# Patient Record
Sex: Male | Born: 1990 | Race: Black or African American | Hispanic: No | Marital: Single | Smoking: Current every day smoker
Health system: Southern US, Community
[De-identification: ages and names within clinical notes are randomized; demographics above are authoritative.]

---
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
## 2014-12-18 ENCOUNTER — Encounter (HOSPITAL_COMMUNITY): Payer: Self-pay | Admitting: Emergency Medicine

## 2014-12-18 ENCOUNTER — Emergency Department (HOSPITAL_COMMUNITY)
Admission: EM | Admit: 2014-12-18 | Discharge: 2014-12-18 | Disposition: A | Discharge status: Home / Self Care | Payer: Self-pay | Attending: Emergency Medicine | Admitting: Emergency Medicine

## 2014-12-18 DIAGNOSIS — J029 Acute pharyngitis, unspecified: Secondary | ICD-10-CM | POA: Insufficient documentation

## 2014-12-18 MED ORDER — PENICILLIN G BENZATHINE 1200000 UNIT/2ML IM SUSP
1.2000 10*6.[IU] | Freq: Once | INTRAMUSCULAR | Status: AC
  Administered 2014-12-18: 1.2 10*6.[IU] via INTRAMUSCULAR
  Filled 2014-12-18: qty 2

## 2014-12-18 NOTE — ED Provider Notes (Signed)
CSN: [id number]     Arrival date & time 12/18/14  1108 History   First MD Initiated Contact with Patient 12/18/14 1150     Chief Complaint  Patient presents with  . Sore Throat    white patches     (Consider location/radiation/quality/duration/timing/severity/associated sxs/prior Treatment) The history is provided by the patient and medical records.    This is a 24 year old male with no significant past medical history presenting to the ED for sore throat. Patient states this initially began 2 days ago and has been progressively worsening. States it is very painful to swallow, no difficulty eating or drinking though.He denies any fever, did have mild headache.No nausea, vomiting or diarrhea. No chest pain shortness of breath.  No intervention tried prior to arrival. Vital signs stable.  No past medical history on file. No past surgical history on file. No family history on file. Social History  Substance Use Topics  . Smoking status: Not on file  . Smokeless tobacco: Not on file  . Alcohol Use: Not on file    Review of Systems  HENT: Positive for sore throat.   All other systems reviewed and are negative.     Allergies  Review of patient's allergies indicates not on file.  Home Medications   Prior to Admission medications   Not on File   BP 121/73 mmHg  Pulse 91  Temp(Src) 99.1 F (37.3 C) (Oral)  Resp 20  SpO2 99% Physical Exam  Constitutional: He is oriented to person, place, and time. He appears well-developed and well-nourished. No distress.  HENT:  Head: Normocephalic and atraumatic.  Mouth/Throat: Uvula is midline, oropharynx is clear and moist and mucous membranes are normal. Normal dentition. No oropharyngeal exudate, posterior oropharyngeal edema, posterior oropharyngeal erythema or tonsillar abscesses.  Tonsils 1+ bilaterally with exudates present; uvula midline without peritonsillar abscess; handling secretions appropriately; no difficulty swallowing or  speaking; normal phonation, no stridor  Eyes: Conjunctivae and EOM are normal. Pupils are equal, round, and reactive to light.  Neck: Normal range of motion. Neck supple.  Cardiovascular: Normal rate, regular rhythm and normal heart sounds.   Pulmonary/Chest: Effort normal and breath sounds normal. No respiratory distress. He has no wheezes.  Musculoskeletal: Normal range of motion. He exhibits no edema.  Neurological: He is alert and oriented to person, place, and time.  Skin: Skin is warm and dry. He is not diaphoretic.  Psychiatric: He has a normal mood and affect.  Nursing note and vitals reviewed.   ED Course  Procedures (including critical care time) Labs Review Labs Reviewed - No data to display  Imaging Review No results found.   EKG Interpretation None      MDM   Final diagnoses:  Sore throat   24 year old male here with sore throat for the past 2 days. Progressively worsening. Patient afebrile, nontoxic. Tonsils 1+ bilaterally with exudate present. Handling secretions well, no difficulty swallowing. No clinical signs of peritonsillar abscess at this time. Patient treated empirically with Bicillin in the ED. Instructed to continue supportive care at home.  Discussed plan with patient, he/she acknowledged understanding and agreed with plan of care.  Return precautions given for new or worsening symptoms.  Garlon Hatchet, PA-C 12/18/14 1248  Melene Plan, DO 12/18/14 [id number]

## 2014-12-18 NOTE — Discharge Instructions (Signed)
You have been treated for strep throat. You should start improving in the next 24-48 hours.  May continue to use over the counter chloraseptic and/or salt water gargles to help with throat pain. Return to the ED for new or worsening symptoms.

## 2014-12-27 ENCOUNTER — Emergency Department (HOSPITAL_COMMUNITY)
Admission: EM | Admit: 2014-12-27 | Discharge: 2014-12-27 | Disposition: A | Discharge status: Home / Self Care | Payer: Self-pay | Attending: Emergency Medicine | Admitting: Emergency Medicine

## 2014-12-27 ENCOUNTER — Encounter (HOSPITAL_COMMUNITY): Payer: Self-pay | Admitting: *Deleted

## 2014-12-27 DIAGNOSIS — R300 Dysuria: Secondary | ICD-10-CM | POA: Insufficient documentation

## 2014-12-27 DIAGNOSIS — A539 Syphilis, unspecified: Secondary | ICD-10-CM

## 2014-12-27 LAB — URINALYSIS, ROUTINE W REFLEX MICROSCOPIC
Glucose, UA: NEGATIVE mg/dL
HGB URINE DIPSTICK: NEGATIVE
KETONES UR: 15 mg/dL — AB
Leukocytes, UA: NEGATIVE
Nitrite: NEGATIVE
PROTEIN: NEGATIVE mg/dL
SPECIFIC GRAVITY, URINE: 1.035 — AB (ref 1.005–1.030)
UROBILINOGEN UA: 1 mg/dL (ref 0.0–1.0)
pH: 6 (ref 5.0–8.0)

## 2014-12-27 MED ORDER — PENICILLIN G BENZATHINE 1200000 UNIT/2ML IM SUSP
2.4000 10*6.[IU] | Freq: Once | INTRAMUSCULAR | Status: AC
  Administered 2014-12-27: 2.4 10*6.[IU] via INTRAMUSCULAR
  Filled 2014-12-27: qty 4

## 2014-12-27 NOTE — ED Provider Notes (Signed)
CSN: [id number]     Arrival date & time 12/27/14  1417 History  This chart was scribed for non-physician practitioner, Teressa Lower, NP, working with Elwin Mocha, MD by Charline Bills, ED Scribe. This patient was seen in room TR05C/TR05C and the patient's care was started at 2:28 PM.   Chief Complaint  Patient presents with  . Exposure to STD   The history is provided by the patient. No language interpreter was used.   HPI Comments: Jimmy [name] is a 24 y.o. male who presents to the Emergency Department for STD exposure. Pt reports a rash to his penis first noticed a few days ago. He states that he was recently released from prison in which he served 5.5 years. Pt reports associated dysuria for the past few days as well. He denies penile discharge or rash on his hands. No h/o STD.   History reviewed. No pertinent past medical history. History reviewed. No pertinent past surgical history. No family history on file. Social History  Substance Use Topics  . Smoking status: Never Smoker   . Smokeless tobacco: None  . Alcohol Use: None    Review of Systems  Genitourinary: Positive for dysuria. Negative for discharge.  Skin: Positive for rash.  All other systems reviewed and are negative.  Allergies  Review of patient's allergies indicates no known allergies.  Home Medications   Prior to Admission medications   Not on File   BP 129/85 mmHg  Pulse 67  Temp(Src) 97.7 F (36.5 C) (Oral)  Resp 16  SpO2 97% Physical Exam  Constitutional: He is oriented to person, place, and time. He appears well-developed and well-nourished. No distress.  HENT:  Head: Normocephalic and atraumatic.  Eyes: Conjunctivae and EOM are normal.  Neck: Neck supple. No tracheal deviation present.  Cardiovascular: Normal rate.   Pulmonary/Chest: Effort normal. No respiratory distress.  Genitourinary: No discharge found.  Bumps on penis.   Musculoskeletal: Normal range of motion.  Neurological: He is  alert and oriented to person, place, and time.  Skin: Skin is warm and dry.  Psychiatric: He has a normal mood and affect. His behavior is normal.  Nursing note and vitals reviewed.  ED Course  Procedures (including critical care time) DIAGNOSTIC STUDIES: Oxygen Saturation is 97% on RA, normal by my interpretation.    COORDINATION OF CARE: 2:31 PM-Discussed treatment plan which includes STD screening and Bicillin LA injection with pt at bedside and pt agreed to plan.   Labs Review Labs Reviewed  URINALYSIS, ROUTINE W REFLEX MICROSCOPIC (NOT AT [facility]) - Abnormal; Notable for the following:    Color, Urine AMBER (*)    Specific Gravity, Urine 1.035 (*)    Bilirubin Urine SMALL (*)    Ketones, ur 15 (*)    All other components within normal limits  HIV ANTIBODY (ROUTINE TESTING)  RPR  GC/CHLAMYDIA PROBE AMP ([place]) NOT AT [facility]   Imaging Review No results found. I have personally reviewed and evaluated these images and lab results as part of my medical decision-making.   EKG Interpretation None      MDM   Final diagnoses:  Syphilis    Pt treated for syphilis here. Cultures sent. No infection noted in urine. No rash to hand or feet  I personally performed the services described in this documentation, which was scribed in my presence. The recorded information has been reviewed and is accurate.    Teressa Lower, NP 12/27/14 1534  Elwin Mocha, MD 12/27/14 [phone]

## 2014-12-27 NOTE — Discharge Instructions (Signed)
Syphilis Syphilis is an infectious disease. It can cause serious complications if left untreated.  CAUSES  Syphilis is caused by a type of bacteria called Treponema pallidum. It is most commonly spread through sexual contact. Syphilis may also spread to a fetus through the blood of the mother.  SIGNS AND SYMPTOMS Symptoms vary depending on the stage of the disease. Some symptoms may disappear without treatment. However, this does not mean that the infection is gone. One form of syphilis (called latent syphilis) has no symptoms.  Primary Syphilis  Painless sores (chancres) in and around the genital organs and mouth.  Swollen lymph nodes near the sores. Secondary Syphilis  A rash or sores over any portion of the body, including the palms of the hands and soles of the feet.  Fever.  Headache.  Sore throat.  Swollen lymph nodes.  New sores in the mouth or on the genitals.  Feeling generally ill.  Having pain in the joints. Tertiary Syphilis The third stage of syphilis involves severe damage to different organs in the body, such as the brain, spinal cord, and heart. Signs and symptoms may include:   Dementia.  Personality and mood changes.  Difficulty walking.  Heart failure.  Fainting.  Enlargement (aneurysm) of the aorta.  Tumors of the skin, bones, or liver.  Muscle weakness.  Sudden "lightning" pains, numbness, or tingling.  Problems with coordination.  Vision changes. DIAGNOSIS   A physical exam will be done.  Blood tests will be done to confirm the diagnosis.  If the disease is in the first or second stages, a fluid (drainage) sample from a sore or rash may be examined under a microscope to detect the disease-causing bacteria.  Fluid around the spine may need to be examined to detect brain damage or inflammation of the brain lining (meningitis).  If the disease is in the third stage, X-rays, CT scans, MRIs, echocardiograms, ultrasounds, or cardiac  catheterization may also be done to detect disease of the heart, aorta, or brain. TREATMENT  Syphilis can be cured with antibiotic medicine if a diagnosis is made early. During the first day of treatment, you may experience fever, chills, headache, nausea, or aching all over your body. This is a normal reaction to the antibiotics.  HOME CARE INSTRUCTIONS   Take your antibiotic medicine as directed by your health care provider. Finish the antibiotic even if you start to feel better. Incomplete treatment will put you at risk for continued infection and could be life threatening.  Take medicines only as directed by your health care provider.  Do not have sexual intercourse until your treatment is completed or as directed by your health care provider.  Inform your recent sexual partners that you were diagnosed with syphilis. They need to seek care and treatment, even if they have no symptoms. It is necessary that all your sexual partners be tested for infection and treated if they have the disease.  Keep all follow-up visits as directed by your health care provider. It is important to keep all your appointments.  If your test results are not ready during your visit, make an appointment with your health care provider to find out the results. Do not assume everything is normal if you have not heard from your health care provider or the medical facility. It is your responsibility to get your test results. SEEK MEDICAL CARE IF:  You continue to have any of the following 24 hours after beginning treatment:  Fever.  Chills.  Headache.  Nausea.  Aching all over your body.  You have symptoms of an allergic reaction to medicine, such as:  Chills.  A headache.  Light-headedness.  A new rash (especially hives).  Difficulty breathing. MAKE SURE YOU:   Understand these instructions.  Will watch your condition.  Will get help right away if you are not doing well or get worse. Document  Released: 02/02/2013 Document Revised: 08/29/2013 Document Reviewed: 02/02/2013 [facility] Patient Information 2015 [place], [place]. This information is not intended to replace advice given to you by your health care provider. Make sure you discuss any questions you have with your health care provider.  Sexually Transmitted Disease A sexually transmitted disease (STD) is a disease or infection that may be passed (transmitted) from person to person, usually during sexual activity. This may happen by way of saliva, semen, blood, vaginal mucus, or urine. Common STDs include:   Gonorrhea.   Chlamydia.   Syphilis.   HIV and AIDS.   Genital herpes.   Hepatitis B and C.   Trichomonas.   Human papillomavirus (HPV).   Pubic lice.   Scabies.  Mites.  Bacterial vaginosis. WHAT ARE CAUSES OF STDs? An STD may be caused by bacteria, a virus, or parasites. STDs are often transmitted during sexual activity if one person is infected. However, they may also be transmitted through nonsexual means. STDs may be transmitted after:   Sexual intercourse with an infected person.   Sharing sex toys with an infected person.   Sharing needles with an infected person or using unclean piercing or tattoo needles.  Having intimate contact with the genitals, mouth, or rectal areas of an infected person.   Exposure to infected fluids during birth. WHAT ARE THE SIGNS AND SYMPTOMS OF STDs? Different STDs have different symptoms. Some people may not have any symptoms. If symptoms are present, they may include:   Painful or bloody urination.   Pain in the pelvis, abdomen, vagina, anus, throat, or eyes.   A skin rash, itching, or irritation.  Growths, ulcerations, blisters, or sores in the genital and anal areas.  Abnormal vaginal discharge with or without bad odor.   Penile discharge in men.   Fever.   Pain or bleeding during sexual intercourse.   Swollen glands in the groin  area.   Yellow skin and eyes (jaundice). This is seen with hepatitis.   Swollen testicles.  Infertility.  Sores and blisters in the mouth. HOW ARE STDs DIAGNOSED? To make a diagnosis, your health care provider may:   Take a medical history.   Perform a physical exam.   Take a sample of any discharge to examine.  Swab the throat, cervix, opening to the penis, rectum, or vagina for testing.  Test a sample of your first morning urine.   Perform blood tests.   Perform a Pap test, if this applies.   Perform a colposcopy.   Perform a laparoscopy.  HOW ARE STDs TREATED? Treatment depends on the STD. Some STDs may be treated but not cured.   Chlamydia, gonorrhea, trichomonas, and syphilis can be cured with antibiotic medicine.   Genital herpes, hepatitis, and HIV can be treated, but not cured, with prescribed medicines. The medicines lessen symptoms.   Genital warts from HPV can be treated with medicine or by freezing, burning (electrocautery), or surgery. Warts may come back.   HPV cannot be cured with medicine or surgery. However, abnormal areas may be removed from the cervix, vagina, or vulva.   If your diagnosis is  confirmed, your recent sexual partners need treatment. This is true even if they are symptom-free or have a negative culture or evaluation. They should not have sex until their health care providers say it is okay. HOW CAN I REDUCE MY RISK OF GETTING AN STD? Take these steps to reduce your risk of getting an STD:  Use latex condoms, dental dams, and water-soluble lubricants during sexual activity. Do not use petroleum jelly or oils.  Avoid having multiple sex partners.  Do not have sex with someone who has other sex partners.  Do not have sex with anyone you do not know or who is at high risk for an STD.  Avoid risky sex practices that can break your skin.  Do not have sex if you have open sores on your mouth or skin.  Avoid drinking too  much alcohol or taking illegal drugs. Alcohol and drugs can affect your judgment and put you in a vulnerable position.  Avoid engaging in oral and anal sex acts.  Get vaccinated for HPV and hepatitis. If you have not received these vaccines in the past, talk to your health care provider about whether one or both might be right for you.   If you are at risk of being infected with HIV, it is recommended that you take a prescription medicine daily to prevent HIV infection. This is called pre-exposure prophylaxis (PrEP). You are considered at risk if:  You are a man who has sex with other men (MSM).  You are a heterosexual man or woman and are sexually active with more than one partner.  You take drugs by injection.  You are sexually active with a partner who has HIV.  Talk with your health care provider about whether you are at high risk of being infected with HIV. If you choose to begin PrEP, you should first be tested for HIV. You should then be tested every 3 months for as long as you are taking PrEP.  WHAT SHOULD I DO IF I THINK I HAVE AN STD?  See your health care provider.   Tell your sexual partner(s). They should be tested and treated for any STDs.  Do not have sex until your health care provider says it is okay. WHEN SHOULD I GET IMMEDIATE MEDICAL CARE? Contact your health care provider right away if:   You have severe abdominal pain.  You are a man and notice swelling or pain in your testicles.  You are a woman and notice swelling or pain in your vagina. Document Released: 07/05/2002 Document Revised: 04/19/2013 Document Reviewed: 11/02/2012 [facility] Patient Information 2015 [place], [place]. This information is not intended to replace advice given to you by your health care provider. Make sure you discuss any questions you have with your health care provider.

## 2014-12-27 NOTE — ED Notes (Signed)
Pt reports that his penis is "burning and has bumps on it". Denies drainage

## 2014-12-28 LAB — HIV ANTIBODY (ROUTINE TESTING W REFLEX): HIV Screen 4th Generation wRfx: NONREACTIVE

## 2014-12-28 LAB — RPR: RPR: NONREACTIVE

## 2014-12-28 LAB — GC/CHLAMYDIA PROBE AMP (~~LOC~~) NOT AT ARMC
CHLAMYDIA, DNA PROBE: NEGATIVE
Neisseria Gonorrhea: NEGATIVE

## 2015-04-20 ENCOUNTER — Encounter (HOSPITAL_COMMUNITY): Payer: Self-pay | Admitting: *Deleted

## 2015-04-20 ENCOUNTER — Emergency Department (HOSPITAL_COMMUNITY)
Admission: EM | Admit: 2015-04-20 | Discharge: 2015-04-20 | Disposition: A | Discharge status: Home / Self Care | Payer: Self-pay | Attending: Emergency Medicine | Admitting: Emergency Medicine

## 2015-04-20 DIAGNOSIS — Z113 Encounter for screening for infections with a predominantly sexual mode of transmission: Secondary | ICD-10-CM | POA: Insufficient documentation

## 2015-04-20 DIAGNOSIS — Y9289 Other specified places as the place of occurrence of the external cause: Secondary | ICD-10-CM | POA: Insufficient documentation

## 2015-04-20 DIAGNOSIS — Z711 Person with feared health complaint in whom no diagnosis is made: Secondary | ICD-10-CM

## 2015-04-20 DIAGNOSIS — S0501XA Injury of conjunctiva and corneal abrasion without foreign body, right eye, initial encounter: Secondary | ICD-10-CM | POA: Insufficient documentation

## 2015-04-20 DIAGNOSIS — X58XXXA Exposure to other specified factors, initial encounter: Secondary | ICD-10-CM | POA: Insufficient documentation

## 2015-04-20 DIAGNOSIS — Y9389 Activity, other specified: Secondary | ICD-10-CM | POA: Insufficient documentation

## 2015-04-20 DIAGNOSIS — F17210 Nicotine dependence, cigarettes, uncomplicated: Secondary | ICD-10-CM | POA: Insufficient documentation

## 2015-04-20 DIAGNOSIS — B3749 Other urogenital candidiasis: Secondary | ICD-10-CM | POA: Insufficient documentation

## 2015-04-20 DIAGNOSIS — Y998 Other external cause status: Secondary | ICD-10-CM | POA: Insufficient documentation

## 2015-04-20 MED ORDER — FLUORESCEIN SODIUM 1 MG OP STRP
1.0000 | ORAL_STRIP | Freq: Once | OPHTHALMIC | Status: AC
  Administered 2015-04-20: 1 via OPHTHALMIC
  Filled 2015-04-20: qty 1

## 2015-04-20 MED ORDER — CLOTRIMAZOLE 1 % EX CREA: TOPICAL_CREAM | CUTANEOUS | Status: AC

## 2015-04-20 MED ORDER — OXYCODONE-ACETAMINOPHEN 5-325 MG PO TABS: 1.0000 | ORAL_TABLET | Freq: Once | ORAL | Status: DC

## 2015-04-20 MED ORDER — TETRACAINE HCL 0.5 % OP SOLN
1.0000 [drp] | Freq: Once | OPHTHALMIC | Status: AC
  Administered 2015-04-20: 2 [drp] via OPHTHALMIC
  Filled 2015-04-20: qty 2

## 2015-04-20 MED ORDER — ERYTHROMYCIN 5 MG/GM OP OINT: TOPICAL_OINTMENT | OPHTHALMIC | Status: AC

## 2015-04-20 NOTE — ED Notes (Signed)
Pt stable, ambulatory, states understanding of discharge instructions 

## 2015-04-20 NOTE — Discharge Instructions (Signed)
Corneal Abrasion The cornea is the clear covering at the front and center of the eye. When looking at the colored portion of the eye (iris), you are looking through the cornea. This very thin tissue is made up of many layers. The surface layer is a single layer of cells (corneal epithelium) and is one of the most sensitive tissues in the body. If a scratch or injury causes the corneal epithelium to come off, it is called a corneal abrasion. If the injury extends to the tissues below the epithelium, the condition is called a corneal ulcer. CAUSES   Scratches.  Trauma.  Foreign body in the eye. Some people have recurrences of abrasions in the area of the original injury even after it has healed (recurrent erosion syndrome). Recurrent erosion syndrome generally improves and goes away with time. SYMPTOMS   Eye pain.  Difficulty or inability to keep the injured eye open.  The eye becomes very sensitive to light.  Recurrent erosions tend to happen suddenly, first thing in the morning, usually after waking up and opening the eye. DIAGNOSIS  Your health care provider can diagnose a corneal abrasion during an eye exam. Dye is usually placed in the eye using a drop or a small paper strip moistened by your tears. When the eye is examined with a special light, the abrasion shows up clearly because of the dye. TREATMENT   Small abrasions may be treated with antibiotic drops or ointment alone.  A pressure patch may be put over the eye. If this is done, follow your doctor's instructions for when to remove the patch. Do not drive or use machines while the eye patch is on. Judging distances is hard to do with a patch on. If the abrasion becomes infected and spreads to the deeper tissues of the cornea, a corneal ulcer can result. This is serious because it can cause corneal scarring. Corneal scars interfere with light passing through the cornea and cause a loss of vision in the involved eye. HOME CARE  INSTRUCTIONS  Use medicine or ointment as directed. Only take over-the-counter or prescription medicines for pain, discomfort, or fever as directed by your health care provider.  Do not drive or operate machinery if your eye is patched. Your ability to judge distances is impaired.  If your health care provider has given you a follow-up appointment, it is very important to keep that appointment. Not keeping the appointment could result in a severe eye infection or permanent loss of vision. If there is any problem keeping the appointment, let your health care provider know. SEEK MEDICAL CARE IF:   You have pain, light sensitivity, and a scratchy feeling in one eye or both eyes.  Your pressure patch keeps loosening up, and you can blink your eye under the patch after treatment.  Any kind of discharge develops from the eye after treatment or if the lids stick together in the morning.  You have the same symptoms in the morning as you did with the original abrasion days, weeks, or months after the abrasion healed.   This information is not intended to replace advice given to you by your health care provider. Make sure you discuss any questions you have with your health care provider.   Document Released: 04/11/2000 Document Revised: 01/03/2015 Document Reviewed: 12/20/2012 Elsevier Interactive Patient Education 2016 Else[employer] Inc[employer]   Candida Infection, Adult A Candida infection (also called yeast, fungus, and Monilia infection) is an overgrowth of yeast that can occur anywhere on the  body. A yeast infection commonly occurs in warm, moist body areas. Usually, the infection remains localized but can spread to become a systemic infection. A yeast infection may be a sign of a more severe disease such as diabetes, leukemia, or AIDS. A yeast infection can occur in both men and women. In women, Candida vaginitis is a vaginal infection. It is one of the most common causes of vaginitis. Men usually do not  have symptoms or know they have an infection until other problems develop. Men may find out they have a yeast infection because their sex partner has a yeast infection. Uncircumcised men are more likely to get a yeast infection than circumcised men. This is because the uncircumcised glans is not exposed to air and does not remain as dry as that of a circumcised glans. Older adults may develop yeast infections around dentures. CAUSES  Women  Antibiotics.  Steroid medication taken for a long time.  Being overweight (obese).  Diabetes.  Poor immune condition.  Certain serious medical conditions.  Immune suppressive medications for organ transplant patients.  Chemotherapy.  Pregnancy.  Menstruation.  Stress and fatigue.  Intravenous drug use.  Oral contraceptives.  Wearing tight-fitting clothes in the crotch area.  Catching it from a sex partner who has a yeast infection.  Spermicide.  Intravenous, urinary, or other catheters. Men  Catching it from a sex partner who has a yeast infection.  Having oral or anal sex with a person who has the infection.  Spermicide.  Diabetes.  Antibiotics.  Poor immune system.  Medications that suppress the immune system.  Intravenous drug use.  Intravenous, urinary, or other catheters. SYMPTOMS  Women  Thick, white vaginal discharge.  Vaginal itching.  Redness and swelling in and around the vagina.  Irritation of the lips of the vagina and perineum.  Blisters on the vaginal lips and perineum.  Painful sexual intercourse.  Low blood sugar (hypoglycemia).  Painful urination.  Bladder infections.  Intestinal problems such as constipation, indigestion, bad breath, bloating, increase in gas, diarrhea, or loose stools. Men  Men may develop intestinal problems such as constipation, indigestion, bad breath, bloating, increase in gas, diarrhea, or loose stools.  Dry, cracked skin on the penis with itching or  discomfort.  Jock itch.  Dry, flaky skin.  Athlete's foot.  Hypoglycemia. DIAGNOSIS  Women  A history and an exam are performed.  The discharge may be examined under a microscope.  A culture may be taken of the discharge. Men  A history and an exam are performed.  Any discharge from the penis or areas of cracked skin will be looked at under the microscope and cultured.  Stool samples may be cultured. TREATMENT  Women  Vaginal antifungal suppositories and creams.  Medicated creams to decrease irritation and itching on the outside of the vagina.  Warm compresses to the perineal area to decrease swelling and discomfort.  Oral antifungal medications.  Medicated vaginal suppositories or cream for repeated or recurrent infections.  Wash and dry the irritation areas before applying the cream.  Eating yogurt with Lactobacillus may help with prevention and treatment.  Sometimes painting the vagina with gentian violet solution may help if creams and suppositories do not work. Men  Antifungal creams and oral antifungal medications.  Sometimes treatment must continue for 30 days after the symptoms go away to prevent recurrence. HOME CARE INSTRUCTIONS  Women  Use cotton underwear and avoid tight-fitting clothing.  Avoid colored, scented toilet paper and deodorant tampons or pads.  Do not douche.  Keep your diabetes under control.  Finish all the prescribed medications.  Keep your skin clean and dry.  Consume milk or yogurt with Lactobacillus-active culture regularly. If you get frequent yeast infections and think that is what the infection is, there are over-the-counter medications that you can get. If the infection does not show healing in 3 days, talk to your caregiver.  Tell your sex partner you have a yeast infection. Your partner may need treatment also, especially if your infection does not clear up or recurs. Men  Keep your skin clean and dry.  Keep your  diabetes under control.  Finish all prescribed medications.  Tell your sex partner that you have a yeast infection so he or she can be treated if necessary. SEEK MEDICAL CARE IF:   Your symptoms do not clear up or worsen in one week after treatment.  You have an oral temperature above 102 F (38.9 C).  You have trouble swallowing or eating for a prolonged time.  You develop blisters on and around your vagina.  You develop vaginal bleeding and it is not your menstrual period.  You develop abdominal pain.  You develop intestinal problems as mentioned above.  You get weak or light-headed.  You have painful or increased urination.  You have pain during sexual intercourse. MAKE SURE YOU:   Understand these instructions.  Will watch your condition.  Will get help right away if you are not doing well or get worse.   This information is not intended to replace advice given to you by your health care provider. Make sure you discuss any questions you have with your health care provider.   Document Released: 05/22/2004 Document Revised: 05/05/2014 Document Reviewed: 10/16/2014 Elsevier Interactive Patient Education [employer]

## 2015-04-20 NOTE — ED Notes (Addendum)
Pt reports right eye pain, hx of same but was told that he may need surgery in future. Pt reports pain when opening his eye and eye waters. Is able to see once he opens eye but does have sensitivity to light. Also wants to be checked for std, reports rash on his penis.

## 2015-04-20 NOTE — ED Provider Notes (Signed)
CSN: [id number]     Arrival date & time 04/20/15  1505 History  By signing my name below, I, Lyndel SafeKaitlyn Shelton, attest that this documentation has been prepared under the direction and in the presence of Marlon Peliffany Nahzir Pohle, PA-C.  Electronically Signed: Lyndel SafeKaitlyn Shelton, ED Scribe. 04/20/2015. 4:04 PM.   Chief Complaint  Patient presents with  . Eye Pain  . SEXUALLY TRANSMITTED DISEASE   The history is provided by the patient. No language interpreter was used.   HPI Comments: Jimmy Sayersmar Laforest is a 24 y.o. male, with a PMhx of right corneal abrasion, who presents to the Emergency Department complaining of sudden onset, constant, moderate right eye pain X 1 day onset without injury or trauma. He associates watery drainage, an injected conjunctiva, photophobia, pain surrounding right orbit, and pain with EOM of right eye. Pt notes a distant history of right eye corneal abrasion when he was told he may need to have surgery and was prescribed eye drops; his right eye pain has been recurrent ever since this incident. His right eye irritation is worse in the mornings and evenings and his eye becomes matted secondary to the watery drainage. He also reports photophobia in left eye and pian in right eye when he opens his left eye. Pt does not wear glasses or contacts. He denies visual disturbances such as blurry vision or decrease/loss of vision in right eye.   The pt is also c/o a constant, worsening rash to his penis that has persisted despite Bicillin treatment for syphilis 4 months ago. He states he would like to have the rash examined. On past visit when he was treated for syphilis, all STD screening was negative. Denies the rash to be pruritic, blisters, or a burning pain to the rash. Also denies any urinary symptoms, fevers, chills, penile pain, swelling or discharge, testicular pain or swelling.    History reviewed. No pertinent past medical history. History reviewed. No pertinent past surgical history. History  reviewed. No pertinent family history. Social History  Substance Use Topics  . Smoking status: Current Every Day Smoker    Types: Cigarettes  . Smokeless tobacco: None  . Alcohol Use: No    Review of Systems  Constitutional: Negative for fever.  Eyes: Positive for photophobia ( R), pain ( R), discharge ( R; watery) and redness ( R, injected conjunctiva). Negative for visual disturbance.  Genitourinary: Negative for dysuria, urgency, frequency, hematuria, discharge, penile swelling, scrotal swelling, penile pain and testicular pain.  Skin: Positive for rash ( penis).  All other systems reviewed and are negative.  Allergies  Review of patient's allergies indicates no known allergies.  Home Medications   Prior to Admission medications   Medication Sig Start Date End Date Taking? Authorizing Provider  clotrimazole (LOTRIMIN) 1 % cream Apply to affected area 2 times daily for 1 month 04/20/15   Marlon Peliffany Annia Gomm, PA-C  erythromycin ophthalmic ointment Place a 1/2 inch ribbon of ointment BID into the lower eyelid for 7 days 04/20/15   Marlon Peliffany Elliott Quade, PA-C   BP 136/73 mmHg  Pulse 64  Temp(Src) 97.5 F (36.4 C) (Oral)  Resp 18  SpO2 99% Physical Exam  Constitutional: He is oriented to person, place, and time. He appears well-developed and well-nourished. No distress.  HENT:  Head: Normocephalic.  Eyes: EOM are normal. Pupils are equal, round, and reactive to light. Right eye exhibits discharge (clear discharge). Right eye exhibits no exudate. Left eye exhibits no chemosis. Right conjunctiva is injected. Right conjunctiva has no hemorrhage.  Left conjunctiva is not injected. Left conjunctiva has no hemorrhage.  Slit lamp exam:      The right eye shows corneal abrasion (small abrasion overlying the pupil) and fluorescein uptake. The right eye shows no corneal flare, no corneal ulcer, no foreign body, no hyphema and no hypopyon.       The left eye shows no fluorescein uptake.  Neck: Normal  range of motion. Neck supple.  Cardiovascular: Normal rate.   Pulmonary/Chest: Effort normal. No respiratory distress.  Genitourinary:  satellite lesion that is flaky to distal penial shaft. approx 1 x 1 cm  Musculoskeletal: Normal range of motion.  Neurological: He is alert and oriented to person, place, and time. Coordination normal.  Skin: Skin is warm.  Psychiatric: He has a normal mood and affect. His behavior is normal.  Nursing note and vitals reviewed.   ED Course  Procedures  DIAGNOSTIC STUDIES: Oxygen Saturation is 99% on RA, normal by my interpretation.    COORDINATION OF CARE: 3:59 PM Discussed treatment plan which includes to perform eye exam with pt. Will prescribe Erythromycin and encouraged pt to follow up with an ophthalmologist. He is agrreeable to plan.   Examined pt's penile rash with chaperone present throughout entire exam. I suspect the rash is fungal and will treat with topical antifungal Lotrimin cream. Pt is requesting STD screening. Penile swab obtained and labs ordered. Will print negative syphilis and HIV tests form prior visit as pt is requesting them and denies having them rechecked at this time.  Labs Review Labs Reviewed  GC/CHLAMYDIA PROBE AMP ([place]) NOT AT [facility]   I have personally reviewed and evaluated these lab results as part of my medical decision-making.   MDM   Final diagnoses:  Corneal abrasion, right, initial encounter  Concern about STD in male without diagnosis  Genital candidiasis in male   Pt arrives for asymptomatic STD check. Initial testing is negative for trichomonas. Discussed safe sexual practices. Pt is advised to follow up for free testing at local health department in the future. Pt appears safe for discharge.  He has an abrasion to his right eye, this is a re-occuring problem and he will need to see an ophthalmologist as soon as possible, understanding that Christmas Eve is tomorrow the patient is to return to the ER  as needed if symptoms are not improving.  Medications  tetracaine (PONTOCAINE) 0.5 % ophthalmic solution 1-2 drop (2 drops Right Eye Given 04/20/15 1614)  fluorescein ophthalmic strip 1 strip (1 strip Right Eye Given 04/20/15 1614)   I personally performed the services described in this documentation, which was scribed in my presence. The recorded information has been reviewed and is accurate.   Marlon Pel, PA-C 04/20/15 1621  Zadie Rhine, MD 04/20/15 [phone]

## 2015-04-21 ENCOUNTER — Emergency Department (HOSPITAL_COMMUNITY)
Admission: EM | Admit: 2015-04-21 | Discharge: 2015-04-21 | Disposition: A | Discharge status: Home / Self Care | Payer: Medicaid Other | Attending: Emergency Medicine | Admitting: Emergency Medicine

## 2015-04-21 ENCOUNTER — Encounter (HOSPITAL_COMMUNITY): Payer: Self-pay | Admitting: *Deleted

## 2015-04-21 DIAGNOSIS — H5711 Ocular pain, right eye: Secondary | ICD-10-CM | POA: Insufficient documentation

## 2015-04-21 DIAGNOSIS — F17210 Nicotine dependence, cigarettes, uncomplicated: Secondary | ICD-10-CM | POA: Insufficient documentation

## 2015-04-21 DIAGNOSIS — Z792 Long term (current) use of antibiotics: Secondary | ICD-10-CM | POA: Insufficient documentation

## 2015-04-21 DIAGNOSIS — H538 Other visual disturbances: Secondary | ICD-10-CM | POA: Insufficient documentation

## 2015-04-21 MED ORDER — OXYCODONE-ACETAMINOPHEN 5-325 MG PO TABS
2.0000 | ORAL_TABLET | Freq: Once | ORAL | Status: AC
Start: 2015-04-21 — End: 2015-04-21
  Administered 2015-04-21: 2 via ORAL
  Filled 2015-04-21: qty 2

## 2015-04-21 MED ORDER — TETRACAINE HCL 0.5 % OP SOLN
2.0000 [drp] | Freq: Once | OPHTHALMIC | Status: AC
  Administered 2015-04-21: 2 [drp] via OPHTHALMIC
  Filled 2015-04-21: qty 2

## 2015-04-21 MED ORDER — FLUORESCEIN SODIUM 1 MG OP STRP
1.0000 | ORAL_STRIP | Freq: Once | OPHTHALMIC | Status: AC
  Administered 2015-04-21: 1 via OPHTHALMIC
  Filled 2015-04-21: qty 1

## 2015-04-21 NOTE — ED Notes (Signed)
Declined W/C at D/C and was escorted to lobby by RN. 

## 2015-04-21 NOTE — Discharge Instructions (Signed)
You have been seen today for eye pain. Follow-up immediately after discharge with the ophthalmologist at the address included in this discharge paperwork. Follow up with PCP as needed. Return to ED should symptoms worsen.

## 2015-04-21 NOTE — ED Notes (Signed)
Attempted visual acuity, patient states "unable to open eyes to read chart"  Patient led back to room by friend, refuses to open eyes.
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
## 2015-04-21 NOTE — ED Notes (Signed)
Patient will not open eye for visual acuity.

## 2015-04-21 NOTE — ED Provider Notes (Signed)
Planes of right eye pain for the past 3 days. Progressively worsening. On exam patient is alert, Glasgow coma score 15 right eye with some conjunctival erythema. Pupil reacts to light. Visual acuity right eye is 20/63 left eye 20/20. He is pain on opening his eyelids. Right Eye is fluorescein negative  Doug SouSam Hakim Minniefield, MD 04/21/15 1434

## 2015-04-21 NOTE — ED Notes (Signed)
PT returns for RT eye pain

## 2015-04-28 LAB — GC/CHLAMYDIA PROBE AMP (~~LOC~~) NOT AT ARMC
CHLAMYDIA, DNA PROBE: NEGATIVE
Neisseria Gonorrhea: NEGATIVE

## 2016-02-11 ENCOUNTER — Ambulatory Visit (HOSPITAL_COMMUNITY)
Admission: EM | Admit: 2016-02-11 | Discharge: 2016-02-11 | Discharge status: Against Medical Advice | Payer: Self-pay | Attending: Internal Medicine | Admitting: Internal Medicine

## 2016-02-11 ENCOUNTER — Ambulatory Visit (HOSPITAL_COMMUNITY): Payer: Self-pay

## 2016-02-11 NOTE — ED Notes (Signed)
No answer in lobby.

## 2016-02-11 NOTE — ED Notes (Signed)
Called patient to return to [facility] due to xray

## 2016-05-19 ENCOUNTER — Inpatient Hospital Stay (HOSPITAL_COMMUNITY)
Admission: EM | Admit: 2016-05-19 | Discharge: 2016-06-26 | DRG: 003 | Disposition: E | Payer: Medicaid Other | Attending: General Surgery | Admitting: General Surgery

## 2016-05-19 ENCOUNTER — Emergency Department (HOSPITAL_COMMUNITY): Payer: Medicaid Other

## 2016-05-19 DIAGNOSIS — Z66 Do not resuscitate: Secondary | ICD-10-CM | POA: Diagnosis not present

## 2016-05-19 DIAGNOSIS — R402112 Coma scale, eyes open, never, at arrival to emergency department: Secondary | ICD-10-CM | POA: Diagnosis present

## 2016-05-19 DIAGNOSIS — G936 Cerebral edema: Secondary | ICD-10-CM | POA: Diagnosis present

## 2016-05-19 DIAGNOSIS — J9600 Acute respiratory failure, unspecified whether with hypoxia or hypercapnia: Secondary | ICD-10-CM

## 2016-05-19 DIAGNOSIS — G932 Benign intracranial hypertension: Secondary | ICD-10-CM | POA: Diagnosis present

## 2016-05-19 DIAGNOSIS — S06369A Traumatic hemorrhage of cerebrum, unspecified, with loss of consciousness of unspecified duration, initial encounter: Principal | ICD-10-CM | POA: Diagnosis present

## 2016-05-19 DIAGNOSIS — S020XXA Fracture of vault of skull, initial encounter for closed fracture: Secondary | ICD-10-CM | POA: Diagnosis present

## 2016-05-19 DIAGNOSIS — R000 Tachycardia, unspecified: Secondary | ICD-10-CM | POA: Diagnosis present

## 2016-05-19 DIAGNOSIS — D6489 Other specified anemias: Secondary | ICD-10-CM | POA: Diagnosis present

## 2016-05-19 DIAGNOSIS — J80 Acute respiratory distress syndrome: Secondary | ICD-10-CM | POA: Diagnosis not present

## 2016-05-19 DIAGNOSIS — Z9911 Dependence on respirator [ventilator] status: Secondary | ICD-10-CM

## 2016-05-19 DIAGNOSIS — S0183XA Puncture wound without foreign body of other part of head, initial encounter: Secondary | ICD-10-CM | POA: Diagnosis present

## 2016-05-19 DIAGNOSIS — R402312 Coma scale, best motor response, none, at arrival to emergency department: Secondary | ICD-10-CM | POA: Diagnosis present

## 2016-05-19 DIAGNOSIS — J189 Pneumonia, unspecified organism: Secondary | ICD-10-CM | POA: Diagnosis not present

## 2016-05-19 DIAGNOSIS — D62 Acute posthemorrhagic anemia: Secondary | ICD-10-CM | POA: Diagnosis not present

## 2016-05-19 DIAGNOSIS — N179 Acute kidney failure, unspecified: Secondary | ICD-10-CM | POA: Diagnosis present

## 2016-05-19 DIAGNOSIS — E8770 Fluid overload, unspecified: Secondary | ICD-10-CM | POA: Diagnosis not present

## 2016-05-19 DIAGNOSIS — J90 Pleural effusion, not elsewhere classified: Secondary | ICD-10-CM | POA: Diagnosis present

## 2016-05-19 DIAGNOSIS — F17210 Nicotine dependence, cigarettes, uncomplicated: Secondary | ICD-10-CM | POA: Diagnosis present

## 2016-05-19 DIAGNOSIS — R739 Hyperglycemia, unspecified: Secondary | ICD-10-CM | POA: Diagnosis present

## 2016-05-19 DIAGNOSIS — T17990A Other foreign object in respiratory tract, part unspecified in causing asphyxiation, initial encounter: Secondary | ICD-10-CM | POA: Diagnosis not present

## 2016-05-19 DIAGNOSIS — W3400XA Accidental discharge from unspecified firearms or gun, initial encounter: Secondary | ICD-10-CM | POA: Diagnosis not present

## 2016-05-19 DIAGNOSIS — R402212 Coma scale, best verbal response, none, at arrival to emergency department: Secondary | ICD-10-CM | POA: Diagnosis present

## 2016-05-19 DIAGNOSIS — Z9889 Other specified postprocedural states: Secondary | ICD-10-CM

## 2016-05-19 DIAGNOSIS — S066X9A Traumatic subarachnoid hemorrhage with loss of consciousness of unspecified duration, initial encounter: Secondary | ICD-10-CM | POA: Diagnosis present

## 2016-05-19 DIAGNOSIS — I959 Hypotension, unspecified: Secondary | ICD-10-CM | POA: Diagnosis not present

## 2016-05-19 DIAGNOSIS — A419 Sepsis, unspecified organism: Secondary | ICD-10-CM | POA: Diagnosis not present

## 2016-05-19 DIAGNOSIS — R0902 Hypoxemia: Secondary | ICD-10-CM

## 2016-05-19 DIAGNOSIS — J9690 Respiratory failure, unspecified, unspecified whether with hypoxia or hypercapnia: Secondary | ICD-10-CM

## 2016-05-19 DIAGNOSIS — S299XXA Unspecified injury of thorax, initial encounter: Secondary | ICD-10-CM

## 2016-05-19 DIAGNOSIS — Z978 Presence of other specified devices: Secondary | ICD-10-CM

## 2016-05-19 DIAGNOSIS — J9601 Acute respiratory failure with hypoxia: Secondary | ICD-10-CM

## 2016-05-19 DIAGNOSIS — S069X6A Unspecified intracranial injury with loss of consciousness greater than 24 hours without return to pre-existing conscious level with patient surviving, initial encounter: Secondary | ICD-10-CM | POA: Diagnosis not present

## 2016-05-19 LAB — PREPARE FRESH FROZEN PLASMA
BLOOD PRODUCT EXPIRATION DATE: 201801262359
Blood Product Expiration Date: 201801262359
ISSUE DATE / TIME: 201801221703
ISSUE DATE / TIME: 201801221703
UNIT TYPE AND RH: 6200
Unit Type and Rh: 6200

## 2016-05-19 LAB — I-STAT CHEM 8, ED
BUN: 11 mg/dL (ref 6–20)
CHLORIDE: 103 mmol/L (ref 101–111)
Calcium, Ion: 1.04 mmol/L — ABNORMAL LOW (ref 1.15–1.40)
Creatinine, Ser: 1.3 mg/dL — ABNORMAL HIGH (ref 0.61–1.24)
Glucose, Bld: 154 mg/dL — ABNORMAL HIGH (ref 65–99)
HCT: 44 % (ref 39.0–52.0)
Hemoglobin: 15 g/dL (ref 13.0–17.0)
POTASSIUM: 3.3 mmol/L — AB (ref 3.5–5.1)
SODIUM: 140 mmol/L (ref 135–145)
TCO2: 24 mmol/L (ref 0–100)

## 2016-05-19 LAB — URINALYSIS, ROUTINE W REFLEX MICROSCOPIC
Bilirubin Urine: NEGATIVE
Glucose, UA: NEGATIVE mg/dL
Hgb urine dipstick: NEGATIVE
KETONES UR: NEGATIVE mg/dL
Leukocytes, UA: NEGATIVE
Nitrite: NEGATIVE
PH: 6 (ref 5.0–8.0)
PROTEIN: 100 mg/dL — AB
Specific Gravity, Urine: 1.023 (ref 1.005–1.030)
Squamous Epithelial / HPF: NONE SEEN

## 2016-05-19 LAB — CBC
HCT: 42.1 % (ref 39.0–52.0)
HEMOGLOBIN: 14.4 g/dL (ref 13.0–17.0)
MCH: 30 pg (ref 26.0–34.0)
MCHC: 34.2 g/dL (ref 30.0–36.0)
MCV: 87.7 fL (ref 78.0–100.0)
Platelets: 168 10*3/uL (ref 150–400)
RBC: 4.8 MIL/uL (ref 4.22–5.81)
RDW: 12.6 % (ref 11.5–15.5)
WBC: 6.8 10*3/uL (ref 4.0–10.5)

## 2016-05-19 LAB — COMPREHENSIVE METABOLIC PANEL
ALBUMIN: 4.2 g/dL (ref 3.5–5.0)
ALK PHOS: 38 U/L (ref 38–126)
ALT: 16 U/L — AB (ref 17–63)
ANION GAP: 10 (ref 5–15)
AST: 33 U/L (ref 15–41)
BUN: 10 mg/dL (ref 6–20)
CALCIUM: 8.5 mg/dL — AB (ref 8.9–10.3)
CO2: 22 mmol/L (ref 22–32)
Chloride: 104 mmol/L (ref 101–111)
Creatinine, Ser: 1.27 mg/dL — ABNORMAL HIGH (ref 0.61–1.24)
GFR calc Af Amer: >60 mL/min (ref >60)
GFR calc non Af Amer: >60 mL/min (ref >60)
Glucose, Bld: 155 mg/dL — ABNORMAL HIGH (ref 65–99)
Potassium: 3.4 mmol/L — ABNORMAL LOW (ref 3.5–5.1)
SODIUM: 136 mmol/L (ref 135–145)
Total Bilirubin: 0.7 mg/dL (ref 0.3–1.2)
Total Protein: 6.2 g/dL — ABNORMAL LOW (ref 6.5–8.1)

## 2016-05-19 LAB — PROTIME-INR
INR: 1.22
PROTHROMBIN TIME: 15.5 s — AB (ref 11.4–15.2)

## 2016-05-19 LAB — I-STAT CG4 LACTIC ACID, ED: LACTIC ACID, VENOUS: 2.16 mmol/L — AB (ref 0.5–1.9)

## 2016-05-19 LAB — ETHANOL

## 2016-05-19 LAB — CDS SEROLOGY

## 2016-05-19 MED ORDER — SODIUM CHLORIDE 0.9 % IV SOLN
INTRAVENOUS | Status: AC | PRN
  Administered 2016-05-19: 1000 mL via INTRAVENOUS

## 2016-05-19 MED ORDER — DOCUSATE SODIUM 100 MG PO CAPS
100.0000 mg | ORAL_CAPSULE | Freq: Two times a day (BID) | ORAL | Status: DC
  Filled 2016-05-19: qty 1

## 2016-05-19 MED ORDER — DEXTROSE 5 % IV SOLN
2.0000 g | Freq: Two times a day (BID) | INTRAVENOUS | Status: DC
  Administered 2016-05-20 – 2016-05-31 (×25): 2 g via INTRAVENOUS
  Filled 2016-05-19 (×26): qty 2

## 2016-05-19 MED ORDER — ACETAMINOPHEN 325 MG PO TABS
650.0000 mg | ORAL_TABLET | ORAL | Status: DC | PRN
  Administered 2016-05-20: 650 mg via ORAL
  Filled 2016-05-19 (×2): qty 2

## 2016-05-19 MED ORDER — SODIUM CHLORIDE 0.9 % IV SOLN
25.0000 ug/h | INTRAVENOUS | Status: DC
  Administered 2016-05-19: 50 ug/h via INTRAVENOUS
  Filled 2016-05-19: qty 50

## 2016-05-19 MED ORDER — ONDANSETRON HCL 4 MG PO TABS: 4.0000 mg | ORAL_TABLET | Freq: Four times a day (QID) | ORAL | Status: DC | PRN

## 2016-05-19 MED ORDER — FENTANYL BOLUS VIA INFUSION
50.0000 ug | INTRAVENOUS | Status: DC | PRN
  Filled 2016-05-19: qty 50

## 2016-05-19 MED ORDER — ROCURONIUM BROMIDE 50 MG/5ML IV SOLN
INTRAVENOUS | Status: AC | PRN
  Administered 2016-05-19: 100 mg via INTRAVENOUS

## 2016-05-19 MED ORDER — ONDANSETRON HCL 4 MG/2ML IJ SOLN: 4.0000 mg | Freq: Four times a day (QID) | INTRAMUSCULAR | Status: DC | PRN

## 2016-05-19 MED ORDER — ETOMIDATE 2 MG/ML IV SOLN
INTRAVENOUS | Status: AC | PRN
  Administered 2016-05-19: 20 mg via INTRAVENOUS

## 2016-05-19 MED ORDER — ORAL CARE MOUTH RINSE
15.0000 mL | Freq: Four times a day (QID) | OROMUCOSAL | Status: DC
  Administered 2016-05-20: 15 mL via OROMUCOSAL

## 2016-05-19 MED ORDER — LEVETIRACETAM 500 MG/5ML IV SOLN
500.0000 mg | Freq: Two times a day (BID) | INTRAVENOUS | Status: DC
  Administered 2016-05-19 – 2016-05-31 (×25): 500 mg via INTRAVENOUS
  Filled 2016-05-19 (×26): qty 5

## 2016-05-19 MED ORDER — CHLORHEXIDINE GLUCONATE 0.12% ORAL RINSE (MEDLINE KIT)
15.0000 mL | Freq: Two times a day (BID) | OROMUCOSAL | Status: DC
  Administered 2016-05-19: 15 mL via OROMUCOSAL

## 2016-05-19 MED ORDER — FENTANYL CITRATE (PF) 100 MCG/2ML IJ SOLN
50.0000 ug | Freq: Once | INTRAMUSCULAR | Status: AC
  Administered 2016-05-19: 50 ug via INTRAVENOUS
  Filled 2016-05-19: qty 2

## 2016-05-19 MED ORDER — VANCOMYCIN HCL IN DEXTROSE 750-5 MG/150ML-% IV SOLN
750.0000 mg | Freq: Three times a day (TID) | INTRAVENOUS | Status: DC
  Administered 2016-05-20 – 2016-05-22 (×7): 750 mg via INTRAVENOUS
  Filled 2016-05-19 (×8): qty 150

## 2016-05-19 MED ORDER — VANCOMYCIN HCL 10 G IV SOLR
1500.0000 mg | Freq: Once | INTRAVENOUS | Status: AC
  Administered 2016-05-19: 1500 mg via INTRAVENOUS
  Filled 2016-05-19: qty 1500

## 2016-05-19 MED ORDER — LIDOCAINE HCL (CARDIAC) 20 MG/ML IV SOLN
INTRAVENOUS | Status: AC | PRN
  Administered 2016-05-19: 100 mg via INTRAVENOUS

## 2016-05-19 NOTE — ED Notes (Signed)
Transported to with RT.

## 2016-05-19 NOTE — Procedures (Signed)
Pt transported to CT on vent, no complications  

## 2016-05-19 NOTE — ED Notes (Signed)
Pt.s mother and father has been updated by  Dr. Shanon RosserPage and also [employer].
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
## 2016-05-19 NOTE — ED Notes (Signed)
[place] [employer] called to check on status
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
## 2016-05-19 NOTE — Progress Notes (Signed)
Orthopedic Tech Progress Note Patient Details:  Jonathon ResidesKelford Uu Doe 04/28/1873 [id number]  Patient ID: Kelford Uu Doe, male   DOB: 04/28/1873, [age] y.o.   MRN: [id number]   Saul FordyceJennifer C Alexiah Koroma 05/18/2016, 6:19 PMLevel 1 Trauma
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
## 2016-05-20 ENCOUNTER — Encounter (HOSPITAL_COMMUNITY): Payer: Self-pay | Admitting: *Deleted

## 2016-05-20 LAB — MAGNESIUM
MAGNESIUM: 1.4 mg/dL — AB (ref 1.7–2.4)
MAGNESIUM: 1.7 mg/dL (ref 1.7–2.4)

## 2016-05-20 LAB — BLOOD GAS, ARTERIAL
Acid-Base Excess: 0.1 mmol/L (ref 0.0–2.0)
Acid-base deficit: 1.2 mmol/L (ref 0.0–2.0)
BICARBONATE: 23.9 mmol/L (ref 20.0–28.0)
Bicarbonate: 22.7 mmol/L (ref 20.0–28.0)
DRAWN BY: 406621
Drawn by: 398981
FIO2: 60
FIO2: 40
MECHVT: 550 mL
O2 SAT: 99.3 %
O2 Saturation: 95 %
PATIENT TEMPERATURE: 98.6
PCO2 ART: 36.6 mmHg (ref 32.0–48.0)
PEEP: 5 cmH2O
PEEP: 5 cmH2O
PH ART: 7.401 (ref 7.350–7.450)
PO2 ART: 75.3 mmHg — AB (ref 83.0–108.0)
Patient temperature: 100.4
RATE: 16 resp/min
RATE: 16 resp/min
VT: 550 mL
pCO2 arterial: 37.8 mmHg (ref 32.0–48.0)
pH, Arterial: 7.43 (ref 7.350–7.450)
pO2, Arterial: 279 mmHg — ABNORMAL HIGH (ref 83.0–108.0)

## 2016-05-20 LAB — TYPE AND SCREEN
ABO/RH(D): AB POS
ANTIBODY SCREEN: NEGATIVE
Unit division: 0
Unit division: 0

## 2016-05-20 LAB — CBC
HCT: 38.2 % — ABNORMAL LOW (ref 39.0–52.0)
Hemoglobin: 12.9 g/dL — ABNORMAL LOW (ref 13.0–17.0)
MCH: 29.3 pg (ref 26.0–34.0)
MCHC: 33.8 g/dL (ref 30.0–36.0)
MCV: 86.6 fL (ref 78.0–100.0)
Platelets: 149 10*3/uL — ABNORMAL LOW (ref 150–400)
RBC: 4.41 MIL/uL (ref 4.22–5.81)
RDW: 12.9 % (ref 11.5–15.5)
WBC: 9.8 10*3/uL (ref 4.0–10.5)

## 2016-05-20 LAB — PROTIME-INR
INR: 1.23
PROTHROMBIN TIME: 15.6 s — AB (ref 11.4–15.2)

## 2016-05-20 LAB — URINALYSIS, ROUTINE W REFLEX MICROSCOPIC
BILIRUBIN URINE: NEGATIVE
GLUCOSE, UA: NEGATIVE mg/dL
Ketones, ur: 20 mg/dL — AB
Leukocytes, UA: NEGATIVE
Nitrite: NEGATIVE
PH: 5 (ref 5.0–8.0)
PROTEIN: 100 mg/dL — AB
Specific Gravity, Urine: 1.033 — ABNORMAL HIGH (ref 1.005–1.030)

## 2016-05-20 LAB — GLUCOSE, CAPILLARY
Glucose-Capillary: 174 mg/dL — ABNORMAL HIGH (ref 65–99)
Glucose-Capillary: 148 mg/dL — ABNORMAL HIGH (ref 65–99)
Glucose-Capillary: 163 mg/dL — ABNORMAL HIGH (ref 65–99)
Glucose-Capillary: 160 mg/dL — ABNORMAL HIGH (ref 65–99)

## 2016-05-20 LAB — BASIC METABOLIC PANEL
Anion gap: 6 (ref 5–15)
BUN: 11 mg/dL (ref 6–20)
CALCIUM: 8.6 mg/dL — AB (ref 8.9–10.3)
CHLORIDE: 105 mmol/L (ref 101–111)
CO2: 23 mmol/L (ref 22–32)
CREATININE: 1.29 mg/dL — AB (ref 0.61–1.24)
GFR calc Af Amer: >60 mL/min (ref >60)
GFR calc non Af Amer: >60 mL/min (ref >60)
Glucose, Bld: 127 mg/dL — ABNORMAL HIGH (ref 65–99)
Potassium: 4.2 mmol/L (ref 3.5–5.1)
SODIUM: 134 mmol/L — AB (ref 135–145)

## 2016-05-20 LAB — PHOSPHORUS
PHOSPHORUS: 2.6 mg/dL (ref 2.5–4.6)
Phosphorus: 2.8 mg/dL (ref 2.5–4.6)

## 2016-05-20 LAB — MRSA PCR SCREENING: MRSA BY PCR: NEGATIVE

## 2016-05-20 LAB — APTT: aPTT: 27 seconds (ref 24–36)

## 2016-05-20 LAB — BLOOD PRODUCT ORDER (VERBAL) VERIFICATION

## 2016-05-20 LAB — TRIGLYCERIDES: TRIGLYCERIDES: 32 mg/dL (ref <150)

## 2016-05-20 LAB — ABO/RH: ABO/RH(D): AB POS

## 2016-05-20 MED ORDER — VITAMIN C 500 MG PO TABS
1000.0000 mg | ORAL_TABLET | Freq: Three times a day (TID) | ORAL | Status: AC
  Administered 2016-05-20 – 2016-05-27 (×21): 1000 mg
  Filled 2016-05-20 (×21): qty 2

## 2016-05-20 MED ORDER — PNEUMOCOCCAL VAC POLYVALENT 25 MCG/0.5ML IJ INJ
0.5000 mL | INJECTION | INTRAMUSCULAR | Status: AC
  Administered 2016-05-21: 0.5 mL via INTRAMUSCULAR
  Filled 2016-05-20: qty 0.5

## 2016-05-20 MED ORDER — SELENIUM 50 MCG PO TABS
200.0000 ug | ORAL_TABLET | Freq: Every day | ORAL | Status: AC
  Administered 2016-05-20 – 2016-05-26 (×7): 200 ug
  Filled 2016-05-20 (×7): qty 4

## 2016-05-20 MED ORDER — CHLORHEXIDINE GLUCONATE 0.12% ORAL RINSE (MEDLINE KIT)
15.0000 mL | Freq: Two times a day (BID) | OROMUCOSAL | Status: DC
  Administered 2016-05-20 – 2016-05-28 (×17): 15 mL via OROMUCOSAL

## 2016-05-20 MED ORDER — DOCUSATE SODIUM 50 MG/5ML PO LIQD
100.0000 mg | Freq: Two times a day (BID) | ORAL | Status: DC
  Administered 2016-05-20 – 2016-05-31 (×19): 100 mg
  Filled 2016-05-20 (×19): qty 10

## 2016-05-20 MED ORDER — ORAL CARE MOUTH RINSE
15.0000 mL | OROMUCOSAL | Status: DC
  Administered 2016-05-20 – 2016-06-01 (×122): 15 mL via OROMUCOSAL

## 2016-05-20 MED ORDER — PIVOT 1.5 CAL PO LIQD
1000.0000 mL | ORAL | Status: DC
  Administered 2016-05-21 – 2016-05-22 (×3): 1000 mL
  Filled 2016-05-20 (×4): qty 1000

## 2016-05-20 MED ORDER — ACETAMINOPHEN 160 MG/5ML PO SOLN
650.0000 mg | ORAL | Status: DC | PRN
  Administered 2016-05-20 (×2): 650 mg
  Filled 2016-05-20 (×2): qty 20.3

## 2016-05-20 MED ORDER — PROPOFOL 1000 MG/100ML IV EMUL
5.0000 ug/kg/min | INTRAVENOUS | Status: DC
  Administered 2016-05-20: 5 ug/kg/min via INTRAVENOUS
  Administered 2016-05-21: 50 ug/kg/min via INTRAVENOUS
  Administered 2016-05-21: 10 ug/kg/min via INTRAVENOUS
  Administered 2016-05-22 (×2): 60 ug/kg/min via INTRAVENOUS
  Filled 2016-05-20 (×7): qty 100

## 2016-05-20 MED ORDER — SODIUM CHLORIDE 0.9 % IV SOLN
INTRAVENOUS | Status: DC
  Administered 2016-05-20: 11:00:00 via INTRAVENOUS
  Filled 2016-05-20 (×3): qty 1000

## 2016-05-20 MED ORDER — VITAL HIGH PROTEIN PO LIQD
1000.0000 mL | ORAL | Status: DC
  Administered 2016-05-20: 1000 mL

## 2016-05-20 MED ORDER — MORPHINE SULFATE (PF) 2 MG/ML IV SOLN
1.0000 mg | INTRAVENOUS | Status: DC | PRN
  Administered 2016-05-20 – 2016-05-21 (×3): 2 mg via INTRAVENOUS
  Filled 2016-05-20 (×3): qty 1

## 2016-05-20 MED ORDER — PRO-STAT SUGAR FREE PO LIQD
30.0000 mL | Freq: Two times a day (BID) | ORAL | Status: DC
  Administered 2016-05-20: 30 mL
  Filled 2016-05-20: qty 30
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
## 2016-05-20 NOTE — Progress Notes (Signed)
Stopped by to visit w/ pt's family, but nurse said they weren't here much yet today (just dad briefly). They have, however, solved  problem (from when pt came into ED) of too many ppl trying to see pt, as only mom and dad now escort siblings or grandmother back to rm (only about 6 ppl total). Will check back later.Marland Kitchen.    05/20/16 1200  Clinical Encounter Type  Visited With Patient not available;Health care provider  Visit Type Follow-up;Critical Care  Referral From Nurse   Ephraim Hamburgerynthia A Mouhamadou Gittleman, Chaplain
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
## 2016-05-20 NOTE — Progress Notes (Signed)
Pt seen and examined. No issues overnight.    EXAM: Temp:  [95.7 F (35.4 C)-101.8 F (38.8 C)] 100.8 F (38.2 C) (01/23 1500) Pulse Rate:  [63-110] 65 (01/23 1500) Resp:  [9-30] 16 (01/23 1500) BP: (110-140)/(60-110) 126/77 (01/23 1500) SpO2:  [98 %-100 %] 100 % (01/23 1500) FiO2 (%):  [40 %-100 %] 40 % (01/23 1500) Weight:  [78 kg (171 lb 15.3 oz)-79.4 kg (175 lb)] 78 kg (171 lb 15.3 oz) (01/22 2153) Intake/Output      01/22 0701 - 01/23 0700 01/23 0701 - 01/24 0700   I.V. (mL/kg) 1142.7 (14.6) 288.5 (3.7)   NG/GT  221.3   IV Piggyback 805 305   Total Intake(mL/kg) 1947.7 (25) 814.8 (10.4)   Urine (mL/kg/hr) 625 285 (0.4)   Total Output 625 285   Net +1322.7 +529.8         Intubated and sedated Right PRRL, Left eye too swollen to examine Opens right eye to voice No spontaneous movements Trace withdrawal Hold fentanyl drip, give IV morphine push for pain Propofol only so we can get a better exam tomorrow

## 2016-05-20 NOTE — Progress Notes (Signed)
Patient ID: Jimmy [name], male   DOB: [birth year], 26 y.o.   MRN: [id number] I spoke with his mother and a friend at the bedside and updated them on his condition and plan of care. Patient examined and I agree with the assessment and plan  Violeta GelinasBurke Mariame Rybolt, MD, MPH, FACS Trauma: [phone] General Surgery: [phone]  05/20/2016 12:48 PM

## 2016-05-20 NOTE — Progress Notes (Signed)
PT Cancellation Note  Patient Details Name: Jimmy [name] MRN: [id number] DOB: [birth year]   Cancelled Treatment:    Reason Eval/Treat Not Completed: Patient not medically ready Holding PT today per RN; pt only opens right eye to voice but not responding to painful stimuli in any extremities. Plan for follow up head CT in AM. Will follow up.   Blake DivineShauna A Ly Bacchi 05/20/2016, 9:46 AM Mylo RedShauna Augustine Brannick, PT, DPT [phone]
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
## 2016-05-21 ENCOUNTER — Inpatient Hospital Stay (HOSPITAL_COMMUNITY): Payer: Medicaid Other

## 2016-05-21 LAB — BLOOD GAS, ARTERIAL
ACID-BASE EXCESS: 1.3 mmol/L (ref 0.0–2.0)
ACID-BASE EXCESS: 2.3 mmol/L — AB (ref 0.0–2.0)
Acid-Base Excess: 2.1 mmol/L — ABNORMAL HIGH (ref 0.0–2.0)
Bicarbonate: 24.8 mmol/L (ref 20.0–28.0)
Bicarbonate: 25.3 mmol/L (ref 20.0–28.0)
Bicarbonate: 25.9 mmol/L (ref 20.0–28.0)
DRAWN BY: 398981
DRAWN BY: 398981
Drawn by: 345601
FIO2: 30
FIO2: 30
FIO2: 30
LHR: 16 {breaths}/min
MECHVT: 550 mL
MECHVT: 550 mL
O2 SAT: 93.2 %
O2 SAT: 98.6 %
O2 Saturation: 98.7 %
PATIENT TEMPERATURE: 101.6
PCO2 ART: 40.2 mmHg (ref 32.0–48.0)
PEEP/CPAP: 5 cmH2O
PEEP/CPAP: 5 cmH2O
PEEP: 5 cmH2O
PH ART: 7.399 (ref 7.350–7.450)
PH ART: 7.433 (ref 7.350–7.450)
PO2 ART: 139 mmHg — AB (ref 83.0–108.0)
PO2 ART: 140 mmHg — AB (ref 83.0–108.0)
Patient temperature: 101.6
Patient temperature: 102.2
RATE: 16 resp/min
RATE: 24 resp/min
VT: 550 mL
pCO2 arterial: 33.2 mmHg (ref 32.0–48.0)
pCO2 arterial: 42.8 mmHg (ref 32.0–48.0)
pH, Arterial: 7.494 — ABNORMAL HIGH (ref 7.350–7.450)
pO2, Arterial: 76.4 mmHg — ABNORMAL LOW (ref 83.0–108.0)

## 2016-05-21 LAB — GLUCOSE, CAPILLARY
GLUCOSE-CAPILLARY: 131 mg/dL — AB (ref 65–99)
GLUCOSE-CAPILLARY: 148 mg/dL — AB (ref 65–99)
Glucose-Capillary: 156 mg/dL — ABNORMAL HIGH (ref 65–99)
Glucose-Capillary: 165 mg/dL — ABNORMAL HIGH (ref 65–99)
Glucose-Capillary: 163 mg/dL — ABNORMAL HIGH (ref 65–99)
Glucose-Capillary: 154 mg/dL — ABNORMAL HIGH (ref 65–99)

## 2016-05-21 LAB — BASIC METABOLIC PANEL
ANION GAP: 10 (ref 5–15)
BUN: 13 mg/dL (ref 6–20)
CHLORIDE: 99 mmol/L — AB (ref 101–111)
CO2: 25 mmol/L (ref 22–32)
Calcium: 8.8 mg/dL — ABNORMAL LOW (ref 8.9–10.3)
Creatinine, Ser: 1.1 mg/dL (ref 0.61–1.24)
GFR calc Af Amer: >60 mL/min (ref >60)
GLUCOSE: 149 mg/dL — AB (ref 65–99)
POTASSIUM: 3.9 mmol/L (ref 3.5–5.1)
SODIUM: 134 mmol/L — AB (ref 135–145)

## 2016-05-21 LAB — PHOSPHORUS
Phosphorus: 2.4 mg/dL — ABNORMAL LOW (ref 2.5–4.6)
Phosphorus: 1.5 mg/dL — ABNORMAL LOW (ref 2.5–4.6)

## 2016-05-21 LAB — CBC
HEMATOCRIT: 34 % — AB (ref 39.0–52.0)
HEMOGLOBIN: 11.5 g/dL — AB (ref 13.0–17.0)
MCH: 29.2 pg (ref 26.0–34.0)
MCHC: 33.8 g/dL (ref 30.0–36.0)
MCV: 86.3 fL (ref 78.0–100.0)
Platelets: 146 10*3/uL — ABNORMAL LOW (ref 150–400)
RBC: 3.94 MIL/uL — ABNORMAL LOW (ref 4.22–5.81)
RDW: 12.4 % (ref 11.5–15.5)
WBC: 10.7 10*3/uL — AB (ref 4.0–10.5)

## 2016-05-21 LAB — MAGNESIUM
Magnesium: 1.8 mg/dL (ref 1.7–2.4)
Magnesium: 1.8 mg/dL (ref 1.7–2.4)

## 2016-05-21 LAB — SODIUM: Sodium: 133 mmol/L — ABNORMAL LOW (ref 135–145)

## 2016-05-21 LAB — LACTIC ACID, PLASMA: Lactic Acid, Venous: 1.4 mmol/L (ref 0.5–1.9)

## 2016-05-21 MED ORDER — ACETAMINOPHEN 160 MG/5ML PO SOLN
650.0000 mg | ORAL | Status: DC | PRN
  Administered 2016-05-21 – 2016-05-30 (×17): 650 mg
  Filled 2016-05-21 (×18): qty 20.3

## 2016-05-21 MED ORDER — SODIUM CHLORIDE 0.9% FLUSH
10.0000 mL | Freq: Two times a day (BID) | INTRAVENOUS | Status: DC
  Administered 2016-05-21 – 2016-05-22 (×2): 20 mL
  Administered 2016-05-23 – 2016-05-24 (×3): 10 mL
  Administered 2016-05-24 – 2016-05-25 (×2): 30 mL
  Administered 2016-05-25: 40 mL
  Administered 2016-05-26 – 2016-05-29 (×8): 10 mL
  Administered 2016-05-30: 40 mL
  Administered 2016-05-31: 20 mL

## 2016-05-21 MED ORDER — SODIUM CHLORIDE 0.9 % IV SOLN
25.0000 ug/h | INTRAVENOUS | Status: DC
  Administered 2016-05-21: 50 ug/h via INTRAVENOUS
  Filled 2016-05-21: qty 50

## 2016-05-21 MED ORDER — SODIUM CHLORIDE 3 % IV SOLN
INTRAVENOUS | Status: DC
  Administered 2016-05-21: 50 mL/h via INTRAVENOUS
  Administered 2016-05-22 – 2016-05-23 (×5): 75 mL/h via INTRAVENOUS
  Administered 2016-05-24 (×2): 50 mL/h via INTRAVENOUS
  Administered 2016-05-25: 25 mL/h via INTRAVENOUS
  Filled 2016-05-21 (×16): qty 500

## 2016-05-21 MED ORDER — FENTANYL CITRATE (PF) 100 MCG/2ML IJ SOLN
50.0000 ug | Freq: Once | INTRAMUSCULAR | Status: AC
  Administered 2016-05-21: 50 ug via INTRAVENOUS
  Filled 2016-05-21: qty 2

## 2016-05-21 MED ORDER — PROPOFOL BOLUS VIA INFUSION
30.0000 mg | Freq: Once | INTRAVENOUS | Status: AC
  Administered 2016-05-21: 30 mg via INTRAVENOUS
  Filled 2016-05-21: qty 30

## 2016-05-21 MED ORDER — SODIUM CHLORIDE 0.9% FLUSH: 10.0000 mL | INTRAVENOUS | Status: DC | PRN

## 2016-05-21 NOTE — Progress Notes (Signed)
Pt seen and examined. No issues overnight.  EXAM: Temp:  [99.3 F (37.4 C)-101.7 F (38.7 C)] 101.7 F (38.7 C) (01/24 1300) Pulse Rate:  [62-132] 132 (01/24 1300) Resp:  [15-28] 25 (01/24 1300) BP: (122-168)/(67-112) 160/89 (01/24 1300) SpO2:  [98 %-100 %] 100 % (01/24 1300) FiO2 (%):  [30 %-40 %] 30 % (01/24 1300) Weight:  [72.5 kg (159 lb 13.3 oz)] 72.5 kg (159 lb 13.3 oz) (01/24 0158) Intake/Output      01/23 0701 - 01/24 0700 01/24 0701 - 01/25 0700   I.V. (mL/kg) 1154.1 (15.9) 331.9 (4.6)   NG/GT 940.8 390   IV Piggyback 760 155   Total Intake(mL/kg) 2854.8 (39.4) 876.9 (12.1)   Urine (mL/kg/hr) 1035 (0.6) 430 (0.9)   Stool 0 (0)    Total Output 1035 430   Net +1819.8 +446.9        Stool Occurrence 1 x     Intubated and sedated Right pupil reactive to light Withdraws weakly bilaterally May place ICP monitor, will discuss with family Suspect he has a superior sagittal sinus injury which would suggest a poor prognosis
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
## 2016-05-21 NOTE — Procedures (Signed)
Procedure: Placement of right frontal external ventricular drain  Details: The right frontal area was clipped of hair and prepped and draped in the usual sterile fashion.  The skin was incised at Kocher's point.  A twist drill hole was made.  Bone chips were removed and irrigated away.  The dura was incised.  An antibiotic impregnated ventricular catheter was passed until there was a tactile "pop" and clear spinal fluid returned.  This was tunneled at least 5cm from the incision. It was secured with a suture.  The incision was closed with staples.  It was connected to a sterile chamber.  It was dressed with a sterile occlusive dressing.
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
## 2016-05-21 NOTE — Progress Notes (Signed)
PT Cancellation Note  Patient Details Name: Jimmy [name] MRN: [id number] DOB: 08-04-1990   Cancelled Treatment:    Reason Eval/Treat Not Completed: Patient not medically ready  Pt now with decerebrate posturing. Will hold today. Will follow up as appropriate.  Blake DivineShauna A Markiah Janeway 05/21/2016, 11:20 AM Mylo RedShauna Therma Lasure, PT, DPT [phone]

## 2016-05-21 NOTE — Procedures (Signed)
Arterial Catheter Insertion Procedure Note Jimmy [name] [employer] [id number] 1990-09-07  Procedure: Insertion of Arterial Catheter  Indications: Blood pressure monitoring and Frequent blood sampling  Procedure Details Consent: Unable to obtain consent because of patient on ventilator. Time Out: Verified patient identification, verified procedure, site/side was marked, verified correct patient position, special equipment/implants available, medications/allergies/relevent history reviewed, required imaging and test results available.  Performed  Maximum sterile technique was used including antiseptics, cap, gloves, gown, hand hygiene, mask and sheet. Skin prep: Chlorhexidine; local anesthetic administered 20 gauge catheter was inserted into left radial artery using the Seldinger technique.  Evaluation Blood flow good; BP tracing good. Complications: No apparent complications.   Durwin GlazeBrown, Jimmy [name] 05/21/2016

## 2016-05-22 ENCOUNTER — Inpatient Hospital Stay (HOSPITAL_COMMUNITY): Payer: Medicaid Other

## 2016-05-22 LAB — BLOOD GAS, ARTERIAL
Acid-Base Excess: 1.4 mmol/L (ref 0.0–2.0)
Bicarbonate: 23.7 mmol/L (ref 20.0–28.0)
Drawn by: 398991
FIO2: 30
MECHVT: 550 mL
O2 Saturation: 99.1 %
PEEP: 5 cmH2O
Patient temperature: 98.9
RATE: 24 {breaths}/min
pCO2 arterial: 27.3 mmHg — ABNORMAL LOW (ref 32.0–48.0)
pH, Arterial: 7.549 — ABNORMAL HIGH (ref 7.350–7.450)
pO2, Arterial: 127 mmHg — ABNORMAL HIGH (ref 83.0–108.0)

## 2016-05-22 LAB — BASIC METABOLIC PANEL
Anion gap: 5 (ref 5–15)
Anion gap: 7 (ref 5–15)
BUN: 13 mg/dL (ref 6–20)
BUN: 14 mg/dL (ref 6–20)
CHLORIDE: 111 mmol/L (ref 101–111)
CO2: 25 mmol/L (ref 22–32)
CO2: 23 mmol/L (ref 22–32)
CREATININE: 1.08 mg/dL (ref 0.61–1.24)
Calcium: 8.2 mg/dL — ABNORMAL LOW (ref 8.9–10.3)
Calcium: 7.9 mg/dL — ABNORMAL LOW (ref 8.9–10.3)
Chloride: 116 mmol/L — ABNORMAL HIGH (ref 101–111)
Creatinine, Ser: 1.1 mg/dL (ref 0.61–1.24)
GFR calc Af Amer: >60 mL/min (ref >60)
GFR calc non Af Amer: >60 mL/min (ref >60)
GFR calc non Af Amer: >60 mL/min (ref >60)
GLUCOSE: 148 mg/dL — AB (ref 65–99)
Glucose, Bld: 115 mg/dL — ABNORMAL HIGH (ref 65–99)
Potassium: 4.1 mmol/L (ref 3.5–5.1)
Potassium: 3.7 mmol/L (ref 3.5–5.1)
Sodium: 141 mmol/L (ref 135–145)
Sodium: 146 mmol/L — ABNORMAL HIGH (ref 135–145)

## 2016-05-22 LAB — CBC WITH DIFFERENTIAL/PLATELET
BASOS ABS: 0 10*3/uL (ref 0.0–0.1)
Basophils Relative: 0 %
EOS PCT: 0 %
Eosinophils Absolute: 0 10*3/uL (ref 0.0–0.7)
HEMATOCRIT: 27 % — AB (ref 39.0–52.0)
Hemoglobin: 9.4 g/dL — ABNORMAL LOW (ref 13.0–17.0)
LYMPHS ABS: 1.1 10*3/uL (ref 0.7–4.0)
LYMPHS PCT: 15 %
MCH: 30 pg (ref 26.0–34.0)
MCHC: 34.8 g/dL (ref 30.0–36.0)
MCV: 86.3 fL (ref 78.0–100.0)
MONO ABS: 1 10*3/uL (ref 0.1–1.0)
MONOS PCT: 14 %
NEUTROS ABS: 5.2 10*3/uL (ref 1.7–7.7)
Neutrophils Relative %: 71 %
PLATELETS: 121 10*3/uL — AB (ref 150–400)
RBC: 3.13 MIL/uL — ABNORMAL LOW (ref 4.22–5.81)
RDW: 12.8 % (ref 11.5–15.5)
WBC: 7.3 10*3/uL (ref 4.0–10.5)

## 2016-05-22 LAB — GLUCOSE, CAPILLARY
GLUCOSE-CAPILLARY: 69 mg/dL (ref 65–99)
GLUCOSE-CAPILLARY: 61 mg/dL — AB (ref 65–99)
Glucose-Capillary: 104 mg/dL — ABNORMAL HIGH (ref 65–99)
Glucose-Capillary: 107 mg/dL — ABNORMAL HIGH (ref 65–99)
Glucose-Capillary: 115 mg/dL — ABNORMAL HIGH (ref 65–99)
Glucose-Capillary: 79 mg/dL (ref 65–99)
Glucose-Capillary: 84 mg/dL (ref 65–99)
Glucose-Capillary: 97 mg/dL (ref 65–99)

## 2016-05-22 LAB — VANCOMYCIN, TROUGH: Vancomycin Tr: 8 ug/mL — ABNORMAL LOW (ref 15–20)

## 2016-05-22 LAB — SODIUM
Sodium: 136 mmol/L (ref 135–145)
Sodium: 148 mmol/L — ABNORMAL HIGH (ref 135–145)

## 2016-05-22 MED ORDER — VANCOMYCIN HCL 10 G IV SOLR
1250.0000 mg | Freq: Three times a day (TID) | INTRAVENOUS | Status: DC
  Administered 2016-05-22 – 2016-05-24 (×8): 1250 mg via INTRAVENOUS
  Filled 2016-05-22 (×9): qty 1250

## 2016-05-22 MED ORDER — PROPOFOL 1000 MG/100ML IV EMUL
25.0000 ug/kg/min | INTRAVENOUS | Status: DC
  Administered 2016-05-22 (×4): 80 ug/kg/min via INTRAVENOUS
  Administered 2016-05-23: 50 ug/kg/min via INTRAVENOUS
  Administered 2016-05-23 (×2): 80 ug/kg/min via INTRAVENOUS
  Administered 2016-05-23: 50 ug/kg/min via INTRAVENOUS
  Administered 2016-05-23: 70 ug/kg/min via INTRAVENOUS
  Administered 2016-05-23: 50 ug/kg/min via INTRAVENOUS
  Administered 2016-05-23: 80 ug/kg/min via INTRAVENOUS
  Administered 2016-05-24 – 2016-05-26 (×14): 50 ug/kg/min via INTRAVENOUS
  Filled 2016-05-22 (×26): qty 100

## 2016-05-22 MED ORDER — ARTIFICIAL TEARS OP OINT
1.0000 "application " | TOPICAL_OINTMENT | Freq: Three times a day (TID) | OPHTHALMIC | Status: DC
  Administered 2016-05-22 – 2016-05-31 (×27): 1 via OPHTHALMIC
  Filled 2016-05-22: qty 3.5

## 2016-05-22 MED ORDER — FENTANYL CITRATE (PF) 100 MCG/2ML IJ SOLN: 100.0000 ug | Freq: Once | INTRAMUSCULAR | Status: DC | PRN

## 2016-05-22 MED ORDER — SODIUM CHLORIDE 0.9 % IV SOLN
3.0000 ug/kg/min | INTRAVENOUS | Status: DC
  Administered 2016-05-22: 3 ug/kg/min via INTRAVENOUS
  Administered 2016-05-22 – 2016-05-29 (×11): 4 ug/kg/min via INTRAVENOUS
  Filled 2016-05-22 (×15): qty 20

## 2016-05-22 MED ORDER — HYDRALAZINE HCL 20 MG/ML IJ SOLN
10.0000 mg | INTRAMUSCULAR | Status: DC | PRN
  Administered 2016-05-30 (×2): 10 mg via INTRAVENOUS
  Filled 2016-05-22 (×2): qty 1

## 2016-05-22 MED ORDER — CISATRACURIUM BOLUS VIA INFUSION
0.0500 mg/kg | Freq: Once | INTRAVENOUS | Status: AC
  Administered 2016-05-22: 3.7 mg via INTRAVENOUS
  Filled 2016-05-22: qty 4

## 2016-05-22 MED ORDER — DEXTROSE 50 % IV SOLN
INTRAVENOUS | Status: AC
  Administered 2016-05-22: 50 mL
  Filled 2016-05-22: qty 50

## 2016-05-22 MED ORDER — FENTANYL CITRATE (PF) 100 MCG/2ML IJ SOLN: 100.0000 ug | Freq: Once | INTRAMUSCULAR | Status: DC

## 2016-05-22 MED ORDER — SODIUM CHLORIDE 0.9 % IV SOLN
100.0000 ug/h | INTRAVENOUS | Status: DC
  Administered 2016-05-22 – 2016-05-28 (×15): 300 ug/h via INTRAVENOUS
  Administered 2016-05-28: 400 ug/h via INTRAVENOUS
  Administered 2016-05-29: 300 ug/h via INTRAVENOUS
  Administered 2016-05-29: 400 ug/h via INTRAVENOUS
  Administered 2016-05-29 – 2016-05-30 (×3): 300 ug/h via INTRAVENOUS
  Administered 2016-05-31 – 2016-06-01 (×4): 325 ug/h via INTRAVENOUS
  Filled 2016-05-22 (×28): qty 50

## 2016-05-22 NOTE — Progress Notes (Signed)
Started pt on fentanyl gtt at 2027 in an attempt to decrease pt's agitated/pain and therefore, control ICP's w/o further invasive interventions. Also began 3% saline in attempt to decrease edema.   Was given information from AM RN to inform on-call neurosurgery MD if pt's ICP remained greater than or equal to 25 after 3% saline had been running at 5250mL/hr for at least 1 hour. 3% was started at 1950 and call was made to on-call MD at 2200 r/t ICP's sustaining greater than 25. MD informed that there were no other interventions that could done at this time other than increasing the 3% saline gtt to 8375mL/hr.   Continued to monitor and draw Na levels as ordered.  Francia GreavesSavannah R Noorah Giammona, RN
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
## 2016-05-22 NOTE — Progress Notes (Signed)
Hypoglycemic Event  CBG: 61 Treatment: D50 IV 25 mL  Symptoms: None  Follow-up CBG: Time:1800 CBG Result:84  Possible Reasons for Event: other.  Tube feeding was turned off for 3 hours because Pharmacy was out of Pivot.      Jimmy, Jimmy Franze [name]  Adult (Non-Pregnant) Hypoglycemia Standing Orders  1.  RN shall initiate Hypoglycemia Standing Orders for emergency measures immediately when:            w Routine or STAT CBG and/or a lab glucose indicates hypoglycemia (CBG < 70 mg/dl)  2.  Treat the patient according to ability to take PO's and severity of hypoglycemia.   3.  If patient is on GlucoStabilizer, follow directions [facility] for hypoglycemic events.  4.  If patient on insulin pump, follow Hypoglycemia Standing Orders.  If patient requires more than one treatment have patient place pump in SUSPEND and notify MD.  DO NOT leave pump in SUSPEND for greater than 30 minutes unless ordered by MD.  A.  Treatment for Mild or Moderate-Patient cooperative and able to swallow    1.  Patient taking PO's and can cooperate   a.  Give one of the following 15 gram CHO options:                           w 1 tube oral dextrose gel                           w 3-4 Glucose tablets                           w 4 oz. Juice                           w 4 oz. regular soda                                    ESRD patients:  clear, regular soda                           w 8 oz. skim milk    b.  Recheck CBG in 15 minutes after treatment                            w If CBG < 70 mg/dl, repeat treatment and recheck until hypoglycemia is resolved                            w If CBG > 70 mg/dl and next meal is more than 1 hour away, give additional 15 grams CHO   2.  Patient NPO-Patient cooperative and no altered mental status    a.  Give 25 ml of D50 IV.   b.  Recheck CBG in 15 minutes after treatment.                             w If CBG is less than 70 mg/dl, repeat treatment and  recheck until hypoglycemia is resolved.   c.  Notify MD for further orders.  SPECIAL CONSIDERATIONS:    a.  If no IV access,                              w Start IV of D5W at [facility]                             w Give 25 ml of D50 IV.    b.  If unable to gain IV access                             w Give Glucagon IM:    i.  1 mg if patient weighs more than 45.5 kg    ii.  0.5 mg if patient weighs less than 45.5 kg   c.  Notify MD for further orders  B.  Treatment for Severe-- Patient unconscious or unable to take PO's safely    1.  Position patient on side   2.  Give 50 ml D50 IV   3.  Recheck CBG in 15 minutes.                    w If CBG is less than 70 mg/dl, repeat treatment and recheck until hypoglycemia is resolved.   4.  Notify MD for further orders.    SPECIAL CONSIDERATIONS:    a.  If no IV access                              w Give Glucagon IM                                        i.  1 mg if patient weighs more than 45.5 kg                                       ii.  0.5 mg if patient weighs less than 45.5 kg                              w Start IV of D5W at 50 ml/hr and give 50 ml D50 IV   b.  If no IV access and active seizure                               w Call Rapid Response   c.  If unable to gain IV access, give Glucagon IM:                              w 1 mg if patient weighs more than 45.5 kg                              w 0.5 mg if patient weighs less than 45.5 kg   d.  Notify MD for further orders.  C.  Complete smart text progress note to document intervention and follow-up CBG   1.  In  [facility] patient chart, click on Notes (left side of screen)   2.  Create Progress Note   3.  Click on [employer].  In the Match box type "hypo" and enter    4.  Double click on [facility] IP HYPOGLYCEMIC EVENT and enter data   5.  MD must be notified if patient is NPO or experienced severe hypoglycemia

## 2016-05-22 NOTE — Progress Notes (Signed)
Patient ID: Jimmy [name], male   DOB: April 01, 1991, 26 y.o.   MRN: [id number] I met with his mother at the bedside and updated her on his status and the plan of care. Georganna Skeans, MD, MPH, FACS Trauma: [phone] General Surgery: [phone]
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
## 2016-05-22 NOTE — Progress Notes (Signed)
PT Cancellation Note  Patient Details Name: Jimmy [name] MRN: [id number] DOB: 1990-06-02   Cancelled Treatment:    Reason Eval/Treat Not Completed: Patient not medically ready;Patient's level of consciousness;PT screened, no needs identified, will sign off Pt s/p EVD placement and 3% saline, also starting paralytics. Not appropriate for PT services.  PT signing off. Please re consult if/when appropriate.   Blake DivineShauna A Rosell Khouri 05/22/2016, 11:06 AM Mylo RedShauna Annaleia Pence, PT, DPT [phone]

## 2016-05-22 NOTE — Progress Notes (Signed)
Notified Dr. Janee Mornhompson that patient's ICP is better when pt.'s head of bed is less than 30 degrees.  Orders received to keep head of bed at 20 degrees.  Will continue to monitor patient.

## 2016-05-22 NOTE — Progress Notes (Signed)
Pt seen and examined. No issues overnight.  EXAM: Temp:  [100.4 F (38 C)-102.6 F (39.2 C)] 101.3 F (38.5 C) (01/25 0930) Pulse Rate:  [61-132] 78 (01/25 0930) Resp:  [14-26] 24 (01/25 0930) BP: (127-206)/(51-108) 135/71 (01/25 0930) SpO2:  [98 %-100 %] 100 % (01/25 0930) Arterial Line BP: (157-215)/(64-91) 157/64 (01/25 0930) FiO2 (%):  [30 %] 30 % (01/25 0800) Weight:  [74.4 kg (164 lb 0.4 oz)] 74.4 kg (164 lb 0.4 oz) (01/25 0545) Intake/Output      01/24 0701 - 01/25 0700 01/25 0701 - 01/26 0700   I.V. (mL/kg) 1076.7 (14.5) 997.7 (13.4)   Other 80    NG/GT 1660 130   IV Piggyback 760    Total Intake(mL/kg) 3576.7 (48.1) 1127.7 (15.2)   Urine (mL/kg/hr) 2200 (1.2)    Drains 120 (0.1) 22 (0.1)   Stool     Total Output 2320 22   Net +1256.7 +1105.7          ICPs elevated despite hypertonic saline Intubated and sedated Right pupil round and reactive to light No movement to painful stimulus  Start paralytics in an attempt to control ICP.  The current constellation of signs and findings is consistent with a superior sagittal sinus injury. I suspect he will have a poor prognosis.

## 2016-05-23 ENCOUNTER — Inpatient Hospital Stay (HOSPITAL_COMMUNITY): Payer: Medicaid Other

## 2016-05-23 DIAGNOSIS — S069X6A Unspecified intracranial injury with loss of consciousness greater than 24 hours without return to pre-existing conscious level with patient surviving, initial encounter: Secondary | ICD-10-CM

## 2016-05-23 LAB — BASIC METABOLIC PANEL
Anion gap: 5 (ref 5–15)
BUN: 14 mg/dL (ref 6–20)
CHLORIDE: 123 mmol/L — AB (ref 101–111)
CO2: 20 mmol/L — AB (ref 22–32)
CREATININE: 1.14 mg/dL (ref 0.61–1.24)
Calcium: 7.9 mg/dL — ABNORMAL LOW (ref 8.9–10.3)
GFR calc non Af Amer: >60 mL/min (ref >60)
Glucose, Bld: 111 mg/dL — ABNORMAL HIGH (ref 65–99)
POTASSIUM: 3.6 mmol/L (ref 3.5–5.1)
Sodium: 148 mmol/L — ABNORMAL HIGH (ref 135–145)

## 2016-05-23 LAB — BLOOD GAS, ARTERIAL
ACID-BASE DEFICIT: 2.1 mmol/L — AB (ref 0.0–2.0)
Acid-base deficit: 1.3 mmol/L (ref 0.0–2.0)
BICARBONATE: 21.6 mmol/L (ref 20.0–28.0)
Bicarbonate: 21.6 mmol/L (ref 20.0–28.0)
DRAWN BY: 44135
DRAWN BY: 39899
FIO2: 30
FIO2: 30
MECHVT: 0.55 mL
O2 SAT: 96.8 %
O2 SAT: 93.8 %
PATIENT TEMPERATURE: 101.7
PCO2 ART: 30.9 mmHg — AB (ref 32.0–48.0)
PCO2 ART: 33.3 mmHg (ref 32.0–48.0)
PEEP/CPAP: 5 cmH2O
PEEP: 5 cmH2O
PH ART: 7.428 (ref 7.350–7.450)
PO2 ART: 92.6 mmHg (ref 83.0–108.0)
Patient temperature: 98.6
RATE: 24 resp/min
RATE: 22 resp/min
VT: 550 mL
pH, Arterial: 7.467 — ABNORMAL HIGH (ref 7.350–7.450)
pO2, Arterial: 70.5 mmHg — ABNORMAL LOW (ref 83.0–108.0)

## 2016-05-23 LAB — CBC
HEMATOCRIT: 24.7 % — AB (ref 39.0–52.0)
HEMOGLOBIN: 8.3 g/dL — AB (ref 13.0–17.0)
MCH: 29.1 pg (ref 26.0–34.0)
MCHC: 33.6 g/dL (ref 30.0–36.0)
MCV: 86.7 fL (ref 78.0–100.0)
Platelets: 149 10*3/uL — ABNORMAL LOW (ref 150–400)
RBC: 2.85 MIL/uL — AB (ref 4.22–5.81)
RDW: 13.1 % (ref 11.5–15.5)
WBC: 4.1 10*3/uL (ref 4.0–10.5)

## 2016-05-23 LAB — GLUCOSE, CAPILLARY
GLUCOSE-CAPILLARY: 93 mg/dL (ref 65–99)
GLUCOSE-CAPILLARY: 103 mg/dL — AB (ref 65–99)
Glucose-Capillary: 72 mg/dL (ref 65–99)
Glucose-Capillary: 87 mg/dL (ref 65–99)
Glucose-Capillary: 122 mg/dL — ABNORMAL HIGH (ref 65–99)
Glucose-Capillary: 104 mg/dL — ABNORMAL HIGH (ref 65–99)

## 2016-05-23 LAB — TRIGLYCERIDES: Triglycerides: 144 mg/dL (ref <150)

## 2016-05-23 LAB — SODIUM
SODIUM: 150 mmol/L — AB (ref 135–145)
SODIUM: 152 mmol/L — AB (ref 135–145)
Sodium: 149 mmol/L — ABNORMAL HIGH (ref 135–145)
Sodium: 154 mmol/L — ABNORMAL HIGH (ref 135–145)

## 2016-05-23 MED ORDER — PIVOT 1.5 CAL PO LIQD
1000.0000 mL | ORAL | Status: DC
  Administered 2016-05-24 – 2016-05-26 (×3): 1000 mL

## 2016-05-23 MED ORDER — PRO-STAT SUGAR FREE PO LIQD
30.0000 mL | Freq: Three times a day (TID) | ORAL | Status: DC
  Administered 2016-05-23 – 2016-05-27 (×12): 30 mL
  Filled 2016-05-23 (×12): qty 30

## 2016-05-23 MED ORDER — PENTOBARBITAL LOAD VIA INFUSION
10.0000 mg/kg | Freq: Once | INTRAVENOUS | Status: AC
  Administered 2016-05-23: 768 mg via INTRAVENOUS

## 2016-05-23 MED ORDER — FUROSEMIDE 10 MG/ML IJ SOLN
40.0000 mg | Freq: Once | INTRAMUSCULAR | Status: AC
  Administered 2016-05-23: 40 mg via INTRAVENOUS
  Filled 2016-05-23: qty 4

## 2016-05-23 MED ORDER — PENTOBARBITAL SODIUM 50 MG/ML IJ SOLN
3.0000 mg/kg/h | INTRAVENOUS | Status: DC
  Administered 2016-05-23: 2 mg/kg/h via INTRAVENOUS
  Administered 2016-05-23: 3 mg/kg/h via INTRAVENOUS
  Administered 2016-05-25 – 2016-05-27 (×3): 1.5 mg/kg/h via INTRAVENOUS
  Administered 2016-05-27: 5 mg/kg/h via INTRAVENOUS
  Administered 2016-05-28: 0.5 mg/kg/h via INTRAVENOUS
  Filled 2016-05-23 (×6): qty 50

## 2016-05-23 NOTE — Progress Notes (Addendum)
Pt seen and examined. No issues overnight.  EXAM: Temp:  [98.2 F (36.8 C)-101.8 F (38.8 C)] 101.3 F (38.5 C) (01/26 1100) Pulse Rate:  [69-100] 77 (01/26 1000) Resp:  [24] 24 (01/26 1100) BP: (109-182)/(45-82) 154/67 (01/26 1100) SpO2:  [97 %-100 %] 97 % (01/26 1000) Arterial Line BP: (117-215)/(47-94) 177/71 (01/26 1100) FiO2 (%):  [30 %] 30 % (01/26 0805) Weight:  [76.8 kg (169 lb 5 oz)] 76.8 kg (169 lb 5 oz) (01/26 0530) Intake/Output      01/25 0701 - 01/26 0700 01/26 0701 - 01/27 0700   I.V. (mL/kg) 4448.4 (57.9)    Other     NG/GT 1595.3    IV Piggyback 1060    Total Intake(mL/kg) 7103.7 (92.5)    Urine (mL/kg/hr) 1350 (0.7) 640 (1.9)   Drains 232 (0.1) 38 (0.1)   Total Output 1582 678   Net +5521.7 -678          ICPs elevated despite paralytics titrated to no twitches Intubated, sedated, paralyzed PERRL but left a little sluggish EVD patent  Will start pentobarbital titrated to burst suppression Condition deteriorating despite increased medical support Poor prognosis

## 2016-05-23 NOTE — Progress Notes (Signed)
Pt was given a bath b/t 0000 and 0030. Afterward, repositioned pt at 20-30 degrees per MD verbal order during AM shift. ICP's maintaining greater than or equal to 25 over two hours after bath. Decreased HOB to 20 degrees and repositioned supine. Informed on-call neurosurgery MD of current pt situation and was given orders to decrease stimulation and maintain current tx - no new orders given.  Will continue to monitor closely.  Francia GreavesSavannah R Candice Lunney, RN

## 2016-05-23 NOTE — Progress Notes (Signed)
LTM day 1 started; Dr Thedore MinsSingh notified. Nurse made aware of patient button.
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
## 2016-05-23 NOTE — Progress Notes (Signed)
Follow up - Trauma and Critical Care  Patient Details:    Jimmy [name] is an 26 y.o. male.  Lines/tubes : Airway 7.5 mm (Active)  Secured at (cm) 24 cm 05/23/2016  8:05 AM  Measured From Lips 05/23/2016  8:05 AM  Secured Location Left 05/23/2016  8:05 AM  Secured By [employer] 05/23/2016  8:05 AM  Tube Holder Repositioned Yes 05/23/2016  3:21 AM  Cuff Pressure (cm H2O) 28 cm H2O 05/23/2016  8:05 AM  Site Condition Dry 05/23/2016  8:05 AM     PICC Triple Lumen 05/21/16 Right Brachial 41 cm 0 cm (Active)  Indication for Insertion or Continuance of Line Administration of hyperosmolar/irritating solutions (i.e. TPN, Vancomycin, etc.) 05/22/2016  8:00 PM  Exposed Catheter (cm) 0 cm 05/21/2016  9:18 PM  Site Assessment Clean;Dry;Intact 05/22/2016  8:00 PM  Lumen #1 Status Infusing 05/22/2016  8:00 PM  Lumen #2 Status Infusing 05/22/2016  8:00 PM  Lumen #3 Status Infusing 05/22/2016  8:00 PM  Dressing Type Transparent;Occlusive 05/22/2016  8:00 PM  Dressing Status Clean;Dry;Intact;Antimicrobial disc in place 05/22/2016  8:00 PM  Line Care Other (Comment) 05/23/2016  1:00 AM  Dressing Change Due 05/28/16 05/22/2016  8:00 PM     Arterial Line 05/21/16 Left Radial (Active)  Site Assessment Clean;Dry;Intact 05/22/2016  8:00 PM  Line Status Pulsatile blood flow 05/22/2016  8:00 PM  Art Line Waveform Appropriate 05/22/2016  8:00 PM  Art Line Interventions Zeroed and calibrated;Leveled;Connections checked and tightened 05/22/2016  8:00 PM  Color/Movement/Sensation Capillary refill less than 3 sec 05/22/2016  8:00 PM  Dressing Type Transparent;Occlusive 05/22/2016  8:00 PM  Dressing Status Clean;Dry;Intact 05/22/2016  8:00 PM  Dressing Change Due 05/28/16 05/22/2016  8:00 PM     NG/OG Tube Orogastric 16 Fr. Center mouth Xray;Aucultation (Active)  Site Assessment Clean;Dry;Intact 05/22/2016  8:00 PM  Ongoing Placement Verification No change in respiratory status;No acute changes, not attributed to  clinical condition 05/22/2016  8:00 PM  Status Infusing tube feed 05/22/2016  8:00 PM  Amount of suction 90 mmHg 05/20/2016  8:00 AM  Drainage Appearance Brown 05/20/2016  8:00 AM  Intake (mL) 100 mL 05/22/2016  9:00 PM     Urethral Catheter [name] RN Temperature probe;Non-latex 16 Fr. (Active)  Indication for Insertion or Continuance of Catheter Chemically paralyzed patients;Other (comment) 05/23/2016  8:00 AM  Site Assessment Clean;Intact 05/22/2016  8:00 PM  Catheter Maintenance Bag below level of bladder;Catheter secured;Drainage bag/tubing not touching floor;Insertion date on drainage bag;No dependent loops;Seal intact 05/22/2016  8:00 PM  Collection Container Standard drainage bag 05/22/2016  8:00 PM  Securement Method Securing device (Describe) 05/22/2016  8:00 PM  Urinary Catheter Interventions Unclamped 05/22/2016  8:00 PM  Output (mL) 230 mL 05/23/2016 10:00 AM     ICP/Ventriculostomy Ventricular drainage catheter with ICP monitoring Right (Active)  Drain Status Open 05/22/2016  8:00 PM  Level 10 cm 05/22/2016  8:00 PM  Status Open to continuous drainage 05/22/2016  8:00 PM  CSF Color Clear 05/22/2016  8:00 PM  Site Assessment Clean;Dry 05/22/2016  8:00 PM  Dressing Status Clean;Dry;Intact 05/22/2016  8:00 PM  Dressing Intervention Other (Comment) 05/22/2016  8:00 PM  Dressing Change Due 05/28/16 05/22/2016  8:00 PM  Output (mL) 12 mL 05/23/2016 10:00 AM    Microbiology/Sepsis markers: Results for orders placed or performed during the hospital encounter of 05-31-2016  MRSA PCR Screening     Status: None   Collection Time: 05/20/16  1:02 AM  Result  Value Ref Range Status   MRSA by PCR NEGATIVE NEGATIVE Final    Comment:        The GeneXpert MRSA Assay (FDA approved for NASAL specimens only), is one component of a comprehensive MRSA colonization surveillance program. It is not intended to diagnose MRSA infection nor to guide or monitor treatment for MRSA infections.      Anti-infectives:  Anti-infectives    Start     Dose/Rate Route Frequency Ordered Stop   05/22/16 1430  vancomycin (VANCOCIN) 1,250 mg in sodium chloride 0.9 % 250 mL IVPB     1,250 mg 166.7 mL/hr over 90 Minutes Intravenous Every 8 hours 05/22/16 1420     05/20/16 0600  vancomycin (VANCOCIN) IVPB 750 mg/150 ml premix  Status:  Discontinued     750 mg 150 mL/hr over 60 Minutes Intravenous Every 8 hours 06/06/2016 2044 05/22/16 1420   06-06-16 2100  cefTRIAXone (ROCEPHIN) 2 g in dextrose 5 % 50 mL IVPB     2 g 100 mL/hr over 30 Minutes Intravenous Every 12 hours 06-Jun-2016 2039     2016/06/06 2045  vancomycin (VANCOCIN) 1,500 mg in sodium chloride 0.9 % 500 mL IVPB     1,500 mg 250 mL/hr over 120 Minutes Intravenous  Once 2016/06/06 2043 05/20/16 0018      Best Practice/Protocols:  VTE Prophylaxis: Mechanical GI Prophylaxis: Proton Pump Inhibitor Continous Sedation Also getting a paralytic, Nimbex  Consults: Treatment Team:  Loura Halt Ditty, MD    Events:  Subjective:    Overnight Issues: High ICP's  Objective:  Vital signs for last 24 hours: Temp:  [98.2 F (36.8 C)-101.8 F (38.8 C)] 101.7 F (38.7 C) (01/26 0930) Pulse Rate:  [69-100] 80 (01/26 0805) Resp:  [24] 24 (01/26 0930) BP: (109-171)/(45-76) 145/66 (01/26 0930) SpO2:  [98 %-100 %] 100 % (01/26 0805) Arterial Line BP: (117-181)/(47-77) 174/70 (01/26 0930) FiO2 (%):  [30 %] 30 % (01/26 0805) Weight:  [76.8 kg (169 lb 5 oz)] 76.8 kg (169 lb 5 oz) (01/26 0530)  Hemodynamic parameters for last 24 hours:    Intake/Output from previous day: 01/25 0701 - 01/26 0700 In: 7103.7 [I.V.:4448.4; NG/GT:1595.3; IV Piggyback:1060] Out: 1582 [Urine:1350; Drains:232]  Intake/Output this shift: Total I/O In: -  Out: 491 [Urine:460; Drains:31]  Vent settings for last 24 hours: Vent Mode: PRVC FiO2 (%):  [30 %] 30 % Set Rate:  [24 bmp] 24 bmp Vt Set:  [550 mL] 550 mL PEEP:  [5 cmH20] 5 cmH20 Plateau Pressure:   [19 cmH20-24 cmH20] 23 cmH20  Physical Exam:  General: no respiratory distress and sedated and paralyzed Neuro: RASS -3 or deeper Resp: clear to auscultation bilaterally CVS: regular rate and rhythm, S1, S2 normal, no murmur, click, rub or gallop and intermittent sinus tachycardia GI: soft, nontender, BS WNL, no r/g and tolerating tube feedings well. Extremities: no edema, no erythema, pulses WNL  Results for orders placed or performed during the hospital encounter of 2016/06/06 (from the past 24 hour(s))  Glucose, capillary     Status: Abnormal   Collection Time: 05/22/16 11:45 AM  Result Value Ref Range   Glucose-Capillary 104 (H) 65 - 99 mg/dL   Comment 1 Notify RN    Comment 2 Document in Chart   Vancomycin, trough     Status: Abnormal   Collection Time: 05/22/16  1:30 PM  Result Value Ref Range   Vancomycin Tr 8 (L) 15 - 20 ug/mL  Basic metabolic panel  Status: Abnormal   Collection Time: 05/22/16  1:30 PM  Result Value Ref Range   Sodium 146 (H) 135 - 145 mmol/L   Potassium 3.7 3.5 - 5.1 mmol/L   Chloride 116 (H) 101 - 111 mmol/L   CO2 23 22 - 32 mmol/L   Glucose, Bld 115 (H) 65 - 99 mg/dL   BUN 14 6 - 20 mg/dL   Creatinine, Ser 9.60 0.61 - 1.24 mg/dL   Calcium 7.9 (L) 8.9 - 10.3 mg/dL   GFR calc non Af Amer >60 >60 mL/min   GFR calc Af Amer >60 >60 mL/min   Anion gap 7 5 - 15  Blood gas, arterial     Status: Abnormal   Collection Time: 05/22/16  2:30 PM  Result Value Ref Range   FIO2 30.00    Delivery systems VENTILATOR    Mode PRESSURE REGULATED VOLUME CONTROL    VT 550.0 mL   LHR 24.0 resp/min   Peep/cpap 5.0 cm H20   pH, Arterial 7.549 (H) 7.350 - 7.450   pCO2 arterial 27.3 (L) 32.0 - 48.0 mmHg   pO2, Arterial 127 (H) 83.0 - 108.0 mmHg   Bicarbonate 23.7 20.0 - 28.0 mmol/L   Acid-Base Excess 1.4 0.0 - 2.0 mmol/L   O2 Saturation 99.1 %   Patient temperature 98.9    Collection site A-LINE    Drawn by [phone]    Sample type ARTERIAL DRAW    Allens test  (pass/fail) PASS PASS  Glucose, capillary     Status: None   Collection Time: 05/22/16  4:11 PM  Result Value Ref Range   Glucose-Capillary 69 65 - 99 mg/dL   Comment 1 Notify RN    Comment 2 Document in Chart   Glucose, capillary     Status: Abnormal   Collection Time: 05/22/16  5:43 PM  Result Value Ref Range   Glucose-Capillary 61 (L) 65 - 99 mg/dL  Glucose, capillary     Status: None   Collection Time: 05/22/16  6:02 PM  Result Value Ref Range   Glucose-Capillary 84 65 - 99 mg/dL  Glucose, capillary     Status: None   Collection Time: 05/22/16  7:49 PM  Result Value Ref Range   Glucose-Capillary 79 65 - 99 mg/dL   Comment 1 Notify RN    Comment 2 Document in Chart   Sodium     Status: Abnormal   Collection Time: 05/22/16  8:10 PM  Result Value Ref Range   Sodium 148 (H) 135 - 145 mmol/L  Glucose, capillary     Status: None   Collection Time: 05/22/16 11:34 PM  Result Value Ref Range   Glucose-Capillary 97 65 - 99 mg/dL  Sodium     Status: Abnormal   Collection Time: 05/23/16  2:10 AM  Result Value Ref Range   Sodium 149 (H) 135 - 145 mmol/L  Blood gas, arterial     Status: Abnormal   Collection Time: 05/23/16  3:25 AM  Result Value Ref Range   FIO2 30.00    Delivery systems VENTILATOR    Mode PRESSURE REGULATED VOLUME CONTROL    VT 0.550 mL   LHR 24 resp/min   Peep/cpap 5.0 cm H20   pH, Arterial 7.467 (H) 7.350 - 7.450   pCO2 arterial 30.9 (L) 32.0 - 48.0 mmHg   pO2, Arterial 92.6 83.0 - 108.0 mmHg   Bicarbonate 21.6 20.0 - 28.0 mmol/L   Acid-base deficit 1.3 0.0 - 2.0 mmol/L  O2 Saturation 96.8 %   Patient temperature 101.7    Collection site A-LINE    Drawn by [phone]    Sample type ARTERIAL   Glucose, capillary     Status: None   Collection Time: 05/23/16  3:52 AM  Result Value Ref Range   Glucose-Capillary 72 65 - 99 mg/dL  Basic metabolic panel     Status: Abnormal   Collection Time: 05/23/16  4:00 AM  Result Value Ref Range   Sodium 148 (H) 135 -  145 mmol/L   Potassium 3.6 3.5 - 5.1 mmol/L   Chloride 123 (H) 101 - 111 mmol/L   CO2 20 (L) 22 - 32 mmol/L   Glucose, Bld 111 (H) 65 - 99 mg/dL   BUN 14 6 - 20 mg/dL   Creatinine, Ser 6.041.14 0.61 - 1.24 mg/dL   Calcium 7.9 (L) 8.9 - 10.3 mg/dL   GFR calc non Af Amer >60 >60 mL/min   GFR calc Af Amer >60 >60 mL/min   Anion gap 5 5 - 15  CBC     Status: Abnormal   Collection Time: 05/23/16  4:00 AM  Result Value Ref Range   WBC 4.1 4.0 - 10.5 K/uL   RBC 2.85 (L) 4.22 - 5.81 MIL/uL   Hemoglobin 8.3 (L) 13.0 - 17.0 g/dL   HCT 54.024.7 (L) 98.139.0 - 19.152.0 %   MCV 86.7 78.0 - 100.0 fL   MCH 29.1 26.0 - 34.0 pg   MCHC 33.6 30.0 - 36.0 g/dL   RDW 47.813.1 29.511.5 - 62.115.5 %   Platelets 149 (L) 150 - 400 K/uL  Triglycerides     Status: None   Collection Time: 05/23/16  4:15 AM  Result Value Ref Range   Triglycerides 144 <150 mg/dL  Sodium     Status: Abnormal   Collection Time: 05/23/16  8:00 AM  Result Value Ref Range   Sodium 150 (H) 135 - 145 mmol/L  Glucose, capillary     Status: None   Collection Time: 05/23/16  8:01 AM  Result Value Ref Range   Glucose-Capillary 87 65 - 99 mg/dL     Assessment/Plan:   NEURO  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  CPP 50's to 70's.  Pupils are reactive bilaterally, the right more briskly than the left.  PULM  No specific issues   Plan: CPM  CARDIO  Sinus Tachycardia   Plan: No changes  RENAL  Hypervolemia   Plan: Will try some diuresis  GI  No specific issues   Plan: Tolerating tube feedings well.  ID  No known infectious problems   Plan: CPM  HEME  Anemia acute blood loss anemia)   Plan: No transfusions yet  ENDO No known problems   Plan: CPM  Global Issues  He is in for the long term program.  If the family agrees will try to get  trach/PEG done next week.  Possible diuresis today.  Cannot diminish total fluids because hypertonic saline is going at 75 and most of fluids ar coming from drips.    LOS: 4 days   Additional comments:I reviewed the patient's new clinical lab test results. cbc/bmet  Critical Care Total Time*: 45 Minutes  Clarissa Laird 05/23/2016  *Care during the described time interval was provided by me and/or other  providers on the critical care team.  I have reviewed this patient's available data, including medical history, events of note, physical examination and test results as part of my evaluation.

## 2016-05-24 ENCOUNTER — Inpatient Hospital Stay (HOSPITAL_COMMUNITY): Payer: Medicaid Other

## 2016-05-24 DIAGNOSIS — J80 Acute respiratory distress syndrome: Secondary | ICD-10-CM

## 2016-05-24 LAB — GLUCOSE, CAPILLARY
GLUCOSE-CAPILLARY: 126 mg/dL — AB (ref 65–99)
GLUCOSE-CAPILLARY: 114 mg/dL — AB (ref 65–99)
GLUCOSE-CAPILLARY: 148 mg/dL — AB (ref 65–99)
GLUCOSE-CAPILLARY: 157 mg/dL — AB (ref 65–99)
Glucose-Capillary: 114 mg/dL — ABNORMAL HIGH (ref 65–99)
Glucose-Capillary: 101 mg/dL — ABNORMAL HIGH (ref 65–99)

## 2016-05-24 LAB — BLOOD GAS, ARTERIAL
Acid-base deficit: 3.6 mmol/L — ABNORMAL HIGH (ref 0.0–2.0)
Acid-base deficit: 2.3 mmol/L — ABNORMAL HIGH (ref 0.0–2.0)
Bicarbonate: 20.7 mmol/L (ref 20.0–28.0)
Bicarbonate: 23.4 mmol/L (ref 20.0–28.0)
DRAWN BY: 236041
Drawn by: 44135
FIO2: 30
FIO2: 100
LHR: 20 {breaths}/min
MECHVT: 420 mL
O2 SAT: 94.9 %
O2 Saturation: 86.3 %
PCO2 ART: 36.2 mmHg (ref 32.0–48.0)
PEEP/CPAP: 14 cmH2O
PEEP: 5 cmH2O
Patient temperature: 98.6
Patient temperature: 98.6
RATE: 30 resp/min
VT: 550 mL
pCO2 arterial: 50.7 mmHg — ABNORMAL HIGH (ref 32.0–48.0)
pH, Arterial: 7.376 (ref 7.350–7.450)
pH, Arterial: 7.286 — ABNORMAL LOW (ref 7.350–7.450)
pO2, Arterial: 77.7 mmHg — ABNORMAL LOW (ref 83.0–108.0)
pO2, Arterial: 58.9 mmHg — ABNORMAL LOW (ref 83.0–108.0)

## 2016-05-24 LAB — CBC
HCT: 24.8 % — ABNORMAL LOW (ref 39.0–52.0)
Hemoglobin: 8.1 g/dL — ABNORMAL LOW (ref 13.0–17.0)
MCH: 29.3 pg (ref 26.0–34.0)
MCHC: 32.7 g/dL (ref 30.0–36.0)
MCV: 89.9 fL (ref 78.0–100.0)
PLATELETS: 161 10*3/uL (ref 150–400)
RBC: 2.76 MIL/uL — AB (ref 4.22–5.81)
RDW: 14.3 % (ref 11.5–15.5)
WBC: 7.5 10*3/uL (ref 4.0–10.5)

## 2016-05-24 LAB — BASIC METABOLIC PANEL
Anion gap: 5 (ref 5–15)
BUN: 17 mg/dL (ref 6–20)
CALCIUM: 7.6 mg/dL — AB (ref 8.9–10.3)
CO2: 22 mmol/L (ref 22–32)
CREATININE: 1.21 mg/dL (ref 0.61–1.24)
Chloride: 129 mmol/L — ABNORMAL HIGH (ref 101–111)
GFR calc non Af Amer: >60 mL/min (ref >60)
Glucose, Bld: 130 mg/dL — ABNORMAL HIGH (ref 65–99)
Potassium: 3.5 mmol/L (ref 3.5–5.1)
SODIUM: 156 mmol/L — AB (ref 135–145)

## 2016-05-24 LAB — POCT I-STAT 3, ART BLOOD GAS (G3+)
ACID-BASE DEFICIT: 7 mmol/L — AB (ref 0.0–2.0)
Bicarbonate: 20.1 mmol/L (ref 20.0–28.0)
O2 SAT: 91 %
PH ART: 7.264 — AB (ref 7.350–7.450)
PO2 ART: 71 mmHg — AB (ref 83.0–108.0)
Patient temperature: 98.7
TCO2: 21 mmol/L (ref 0–100)
pCO2 arterial: 44.4 mmHg (ref 32.0–48.0)

## 2016-05-24 LAB — SODIUM
SODIUM: 156 mmol/L — AB (ref 135–145)
SODIUM: 157 mmol/L — AB (ref 135–145)
Sodium: 157 mmol/L — ABNORMAL HIGH (ref 135–145)
Sodium: 158 mmol/L — ABNORMAL HIGH (ref 135–145)

## 2016-05-24 MED ORDER — IPRATROPIUM-ALBUTEROL 0.5-2.5 (3) MG/3ML IN SOLN
3.0000 mL | Freq: Four times a day (QID) | RESPIRATORY_TRACT | Status: DC
  Administered 2016-05-24 – 2016-06-01 (×31): 3 mL via RESPIRATORY_TRACT
  Filled 2016-05-24 (×30): qty 3

## 2016-05-24 MED ORDER — SODIUM CHLORIDE 3 % IN NEBU
4.0000 mL | INHALATION_SOLUTION | RESPIRATORY_TRACT | Status: DC
  Administered 2016-05-24 – 2016-05-25 (×5): 4 mL via RESPIRATORY_TRACT
  Filled 2016-05-24 (×6): qty 4

## 2016-05-24 MED ORDER — FREE WATER
200.0000 mL | Freq: Four times a day (QID) | Status: DC
  Administered 2016-05-24 – 2016-05-25 (×5): 200 mL

## 2016-05-24 NOTE — Progress Notes (Signed)
RT note- fio2 and peep increased for sp02 86%.
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
## 2016-05-24 NOTE — Procedures (Signed)
Bronchoscopy Procedure Note  Date of Operation: 05/24/2016  Pre-op Diagnosis: Mucus Plug  Post-op Diagnosis: Mucus plug  Surgeon: Jamie KatoRIMBLE, Aimy Sweeting  Assistants: None  Anesthesia: None beyond sedation given in ICU, which included barbituates and paralytics  Operation: Flexible fiberoptic bronchoscopy, diagnostic and theraputic  Findings: Large amounts of mucus plugging, R greater than L, improved somewhat with suction, but limited success. Procedure limited by hypoxia  Specimen: Tracheal aspirate collected and sent to lab.  Estimated Blood Loss: none  Drains: None  Complications: Transient hypoxia, but overall, clinical picture slightly improved following bronch  Indications and History: The patient is a 26 y.o. male with pneumonia.  Description of Procedure:  The bronchoscope was passed through the ETT.  Careful inspection of the tracheal lumen was accomplished. The scope was sequentially passed into the left main and then left upper and lower bronchi and segmental bronchi. Bronchial washings were collected as specimens.  The procedure was limited by hypoxia.  Attestation: I performed the procedure.  Gentry Seeber
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
## 2016-05-24 NOTE — Procedures (Signed)
Electroencephalogram report- LTM  Ordering Physician : Rhona Leavensimothy Oster EEG number: [id number] [phone]    Beginning date and time: 05/23/2016 3:16PM Ending date and time:  05/24/2016 1PM  Day of study: 1  Medications include: Pentobarbital  MENTAL STATUS (per technician's notes): Intubated. Sedated. Unresponsive.  HISTORY: This 24 hours of intensive EEG monitoring with simultaneous video monitoring was performed for this patient with unresponsiveness. Patient is now intubated and sedated.  This EEG was requested to rule out subclinical electrographic seizures and monitor for pharmacological induced burst suppression coma.  TECHNICAL DESCRIPTION:  The study consists of a continuous 16-channel multi-montage digital video EEG recording with twenty-one electrodes placed according to the International 10-20 System. Additional leads included eye leads, true temporal leads (T1, T2), and an EKG lead. Activation procedures were not done due to mental status.  REPORT:  The background activity consists of bursts of burst suppression pattern with 2-3 seconds of burst followed by 1015 seconds of suppression. No electrographic seizures were seen.  There were no pushbutton activations events during this recording.   INTERPRETATION: This is an abnormal EEG due to: 1) Bursts suppression pattern  Clinical Correlation: This 24 hours of continuous EEG monitoring with simultaneous video monitoring preformed  for this patient who is intubated and sedated c/w burst/suppression pattern.

## 2016-05-24 NOTE — Progress Notes (Signed)
Patient ID: Jimmy [name], male   DOB: [birth year], 26 y.o.   MRN: [id number] Patient sedated and intubated. In a pentobarbital coma. Ventriculostomy patent. No exam available

## 2016-05-24 NOTE — Procedures (Signed)
Bedside Bronchoscopy Procedure Note Zonia Kiefmar [employer] [id number] Jun 30, 1990  Procedure: Bronchoscopy Indications: Diagnostic evaluation of the airways, Obtain specimens for culture and/or other diagnostic studies and Remove secretions  Procedure Details:  Bite block in place: No; pt medically paralyzed In preparation for procedure, Patient hyper-oxygenated with 100 % FiO2 Airway entered and the following bronchi were examined: RUL, RML, RLL, LUL and LLL.   Bronchoscope removed.    Evaluation BP 127/72   Pulse (!) 113   Temp (!) 100.9 F (38.3 C)   Resp (!) 30   Ht 5\' 9"  (1.753 m)   Wt 179 lb 10.8 oz (81.5 kg)   SpO2 (!) 89%   BMI 26.53 kg/m  Breath Sounds:Diminished and Fine crackles O2 sats: stable throughout Patient's Current Condition: stable Specimens: sputum culture Complications: No apparent complications Patient did tolerate procedure well.   Berenice PrimasSharp, Emeli Goguen S 05/24/2016, 9:04 PM

## 2016-05-24 NOTE — Progress Notes (Signed)
LTM continues.  Pt checked, skin monitored, no skin breakdown noted.  Impedances within acceptable range. Will continue to monitor.
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
## 2016-05-24 NOTE — Progress Notes (Signed)
eLink Physician-Brief Progress Note Patient Name: Jimmy [name] DOB: August 15, 1990 MRN: [id number]   Date of Service  05/24/2016  HPI/Events of Note  Hypoxia - CXR from 6 AM = Moderate R pleural effusion and diffuse L lung infiltrate. PCCM consulted, however, has not seen the patient yet.   eICU Interventions  Will increase PEEP to 14.      Intervention Category Major Interventions: Hypoxemia - evaluation and management  Chrisopher Pustejovsky Eugene 05/24/2016, 6:05 PM

## 2016-05-25 ENCOUNTER — Inpatient Hospital Stay (HOSPITAL_COMMUNITY): Payer: Medicaid Other

## 2016-05-25 DIAGNOSIS — R0902 Hypoxemia: Secondary | ICD-10-CM

## 2016-05-25 DIAGNOSIS — J80 Acute respiratory distress syndrome: Secondary | ICD-10-CM

## 2016-05-25 DIAGNOSIS — Z978 Presence of other specified devices: Secondary | ICD-10-CM

## 2016-05-25 DIAGNOSIS — J9600 Acute respiratory failure, unspecified whether with hypoxia or hypercapnia: Secondary | ICD-10-CM

## 2016-05-25 LAB — BLOOD GAS, ARTERIAL
ACID-BASE DEFICIT: 2 mmol/L (ref 0.0–2.0)
Acid-base deficit: 3.7 mmol/L — ABNORMAL HIGH (ref 0.0–2.0)
BICARBONATE: 23.7 mmol/L (ref 20.0–28.0)
BICARBONATE: 22.7 mmol/L (ref 20.0–28.0)
DRAWN BY: 24513
Drawn by: 236041
FIO2: 100
FIO2: 90
LHR: 30 {breaths}/min
MECHVT: 420 mL
O2 Saturation: 92.1 %
O2 Saturation: 93.8 %
PEEP/CPAP: 14 cmH2O
PEEP/CPAP: 14 cmH2O
Patient temperature: 102
Patient temperature: 102.2
RATE: 30 resp/min
VT: 420 mL
pCO2 arterial: 56.3 mmHg — ABNORMAL HIGH (ref 32.0–48.0)
pCO2 arterial: 61 mmHg — ABNORMAL HIGH (ref 32.0–48.0)
pH, Arterial: 7.209 — ABNORMAL LOW (ref 7.350–7.450)
pH, Arterial: 7.259 — ABNORMAL LOW (ref 7.350–7.450)
pO2, Arterial: 78.1 mmHg — ABNORMAL LOW (ref 83.0–108.0)
pO2, Arterial: 93.9 mmHg (ref 83.0–108.0)

## 2016-05-25 LAB — GLUCOSE, CAPILLARY
GLUCOSE-CAPILLARY: 118 mg/dL — AB (ref 65–99)
Glucose-Capillary: 130 mg/dL — ABNORMAL HIGH (ref 65–99)
Glucose-Capillary: 130 mg/dL — ABNORMAL HIGH (ref 65–99)
Glucose-Capillary: 146 mg/dL — ABNORMAL HIGH (ref 65–99)
Glucose-Capillary: 118 mg/dL — ABNORMAL HIGH (ref 65–99)

## 2016-05-25 LAB — CBC
HCT: 24.6 % — ABNORMAL LOW (ref 39.0–52.0)
Hemoglobin: 7.7 g/dL — ABNORMAL LOW (ref 13.0–17.0)
MCH: 29.2 pg (ref 26.0–34.0)
MCHC: 31.3 g/dL (ref 30.0–36.0)
MCV: 93.2 fL (ref 78.0–100.0)
PLATELETS: 171 10*3/uL (ref 150–400)
RBC: 2.64 MIL/uL — ABNORMAL LOW (ref 4.22–5.81)
RDW: 15.1 % (ref 11.5–15.5)
WBC: 11.1 10*3/uL — AB (ref 4.0–10.5)

## 2016-05-25 LAB — VANCOMYCIN, TROUGH: VANCOMYCIN TR: 24 ug/mL — AB (ref 15–20)

## 2016-05-25 LAB — SODIUM
SODIUM: 158 mmol/L — AB (ref 135–145)
Sodium: 160 mmol/L — ABNORMAL HIGH (ref 135–145)
Sodium: 161 mmol/L (ref 135–145)
Sodium: 157 mmol/L — ABNORMAL HIGH (ref 135–145)

## 2016-05-25 MED ORDER — VANCOMYCIN HCL IN DEXTROSE 1-5 GM/200ML-% IV SOLN
1000.0000 mg | Freq: Three times a day (TID) | INTRAVENOUS | Status: DC
  Administered 2016-05-25 – 2016-05-27 (×7): 1000 mg via INTRAVENOUS
  Filled 2016-05-25 (×9): qty 200

## 2016-05-25 MED ORDER — SODIUM CHLORIDE 3 % IN NEBU
4.0000 mL | INHALATION_SOLUTION | RESPIRATORY_TRACT | Status: AC
  Administered 2016-05-25 – 2016-05-26 (×6): 4 mL via RESPIRATORY_TRACT
  Filled 2016-05-25 (×6): qty 4

## 2016-05-25 MED ORDER — FREE WATER
300.0000 mL | Status: DC
  Administered 2016-05-25 – 2016-05-26 (×5): 300 mL

## 2016-05-25 MED ORDER — SODIUM CHLORIDE 0.45 % IV SOLN
INTRAVENOUS | Status: DC
  Administered 2016-05-25 – 2016-05-30 (×3): via INTRAVENOUS
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
## 2016-05-25 NOTE — Procedures (Signed)
Electroencephalogram report- LTM  Ordering Physician : Rhona Leavensimothy Oster EEG number: [phone]    Beginning date and time: 05/24/2016 1PM Ending date and time:  05/25/2016 10AM  Day of study: 2  Medications include: Pentobarbital  MENTAL STATUS (per technician's notes): Intubated. Sedated. Unresponsive.  HISTORY: This 24 hours of intensive EEG monitoring with simultaneous video monitoring was performed for this patient with unresponsiveness. Patient is now intubated and sedated.  This EEG was requested to rule out subclinical electrographic seizures and monitor for pharmacological induced burst suppression coma.  TECHNICAL DESCRIPTION:  The study consists of a continuous 16-channel multi-montage digital video EEG recording with twenty-one electrodes placed according to the International 10-20 System. Additional leads included eye leads, true temporal leads (T1, T2), and an EKG lead. Activation procedures were not done due to mental status.  REPORT:  The background activity consists of bursts of burst suppression pattern with 2-3 seconds of burst followed by 10-15 seconds of suppression. Overnight, the amplitude and the duration of the burst decreased with about 1-2 seconds of bursts after 20-25 seconds of suppression. This improved towards the end of the recording returning to longer bursts lasting 2-5 seconds. No electrographic seizures were seen.  There were no pushbutton activations events during this recording.   INTERPRETATION: This is an abnormal EEG due to: 1) Burst suppression pattern  Clinical Correlation: This 24 hours of continuous EEG monitoring with simultaneous video monitoring preformed  for this patient who is intubated and sedated is c/w burst/suppression pattern.

## 2016-05-25 NOTE — Progress Notes (Signed)
Patient ID: Zonia Kiefmar Xxxclark, male   DOB: Jun 11, 1990, [age] y.o.   MRN: [id number] Exam available. Ventric patent

## 2016-05-25 NOTE — Progress Notes (Signed)
Check electrodes, impedances within acepable range. No skin breakdown noted. LTM  Continues.
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
## 2016-05-26 ENCOUNTER — Encounter (HOSPITAL_COMMUNITY): Admission: EM | Disposition: E | Payer: Self-pay | Source: Home / Self Care

## 2016-05-26 ENCOUNTER — Inpatient Hospital Stay (HOSPITAL_COMMUNITY): Payer: Medicaid Other

## 2016-05-26 DIAGNOSIS — G932 Benign intracranial hypertension: Secondary | ICD-10-CM

## 2016-05-26 LAB — BLOOD GAS, ARTERIAL
ACID-BASE DEFICIT: 1.4 mmol/L (ref 0.0–2.0)
BICARBONATE: 23.9 mmol/L (ref 20.0–28.0)
Drawn by: 23604
FIO2: 30
MECHVT: 420 mL
O2 SAT: 93.7 %
PEEP/CPAP: 14 cmH2O
PH ART: 7.313 — AB (ref 7.350–7.450)
Patient temperature: 99
RATE: 30 resp/min
pCO2 arterial: 48.7 mmHg — ABNORMAL HIGH (ref 32.0–48.0)
pO2, Arterial: 77 mmHg — ABNORMAL LOW (ref 83.0–108.0)

## 2016-05-26 LAB — BASIC METABOLIC PANEL
ANION GAP: 6 (ref 5–15)
Anion gap: 5 (ref 5–15)
BUN: 23 mg/dL — ABNORMAL HIGH (ref 6–20)
BUN: 26 mg/dL — ABNORMAL HIGH (ref 6–20)
CALCIUM: 8 mg/dL — AB (ref 8.9–10.3)
CALCIUM: 8 mg/dL — AB (ref 8.9–10.3)
CHLORIDE: 123 mmol/L — AB (ref 101–111)
CO2: 25 mmol/L (ref 22–32)
CO2: 25 mmol/L (ref 22–32)
Chloride: 127 mmol/L — ABNORMAL HIGH (ref 101–111)
Creatinine, Ser: 1.4 mg/dL — ABNORMAL HIGH (ref 0.61–1.24)
Creatinine, Ser: 1.59 mg/dL — ABNORMAL HIGH (ref 0.61–1.24)
GFR calc Af Amer: >60 mL/min (ref >60)
GFR calc non Af Amer: >60 mL/min (ref >60)
GFR calc non Af Amer: 60 mL/min — ABNORMAL LOW (ref >60)
Glucose, Bld: 123 mg/dL — ABNORMAL HIGH (ref 65–99)
Glucose, Bld: 154 mg/dL — ABNORMAL HIGH (ref 65–99)
POTASSIUM: 3.4 mmol/L — AB (ref 3.5–5.1)
POTASSIUM: 3.5 mmol/L (ref 3.5–5.1)
Sodium: 157 mmol/L — ABNORMAL HIGH (ref 135–145)
Sodium: 154 mmol/L — ABNORMAL HIGH (ref 135–145)

## 2016-05-26 LAB — CBC
HEMATOCRIT: 23.4 % — AB (ref 39.0–52.0)
HEMOGLOBIN: 7.3 g/dL — AB (ref 13.0–17.0)
MCH: 29.1 pg (ref 26.0–34.0)
MCHC: 31.2 g/dL (ref 30.0–36.0)
MCV: 93.2 fL (ref 78.0–100.0)
Platelets: 210 10*3/uL (ref 150–400)
RBC: 2.51 MIL/uL — AB (ref 4.22–5.81)
RDW: 15.3 % (ref 11.5–15.5)
WBC: 8.1 10*3/uL (ref 4.0–10.5)

## 2016-05-26 LAB — GLUCOSE, CAPILLARY
GLUCOSE-CAPILLARY: 110 mg/dL — AB (ref 65–99)
GLUCOSE-CAPILLARY: 131 mg/dL — AB (ref 65–99)
Glucose-Capillary: 114 mg/dL — ABNORMAL HIGH (ref 65–99)
Glucose-Capillary: 126 mg/dL — ABNORMAL HIGH (ref 65–99)
Glucose-Capillary: 129 mg/dL — ABNORMAL HIGH (ref 65–99)
Glucose-Capillary: 130 mg/dL — ABNORMAL HIGH (ref 65–99)
Glucose-Capillary: 131 mg/dL — ABNORMAL HIGH (ref 65–99)

## 2016-05-26 LAB — SODIUM
SODIUM: 157 mmol/L — AB (ref 135–145)
SODIUM: 156 mmol/L — AB (ref 135–145)
SODIUM: 155 mmol/L — AB (ref 135–145)
SODIUM: 155 mmol/L — AB (ref 135–145)

## 2016-05-26 LAB — POCT I-STAT 3, ART BLOOD GAS (G3+)
Acid-base deficit: 3 mmol/L — ABNORMAL HIGH (ref 0.0–2.0)
Bicarbonate: 22.4 mmol/L (ref 20.0–28.0)
O2 Saturation: 94 %
PCO2 ART: 44.6 mmHg (ref 32.0–48.0)
PO2 ART: 80 mmHg — AB (ref 83.0–108.0)
Patient temperature: 37.5
TCO2: 24 mmol/L (ref 0–100)
pH, Arterial: 7.31 — ABNORMAL LOW (ref 7.350–7.450)

## 2016-05-26 LAB — TRIGLYCERIDES: TRIGLYCERIDES: 306 mg/dL — AB (ref <150)

## 2016-05-26 SURGERY — CREATION, TRACHEOSTOMY
Anesthesia: Monitor Anesthesia Care | Site: Neck

## 2016-05-26 SURGERY — INSERTION, PEG TUBE
Anesthesia: General

## 2016-05-26 MED ORDER — FUROSEMIDE 10 MG/ML IJ SOLN
40.0000 mg | Freq: Once | INTRAMUSCULAR | Status: AC
  Administered 2016-05-26: 40 mg via INTRAVENOUS
  Filled 2016-05-26: qty 4

## 2016-05-26 MED ORDER — SODIUM CHLORIDE 0.9 % IV SOLN
0.0000 mg/h | INTRAVENOUS | Status: DC
  Administered 2016-05-26: 2 mg/h via INTRAVENOUS
  Administered 2016-05-27: 4 mg/h via INTRAVENOUS
  Administered 2016-05-27 (×2): 6 mg/h via INTRAVENOUS
  Administered 2016-05-28: 10 mg/h via INTRAVENOUS
  Administered 2016-05-28: 8 mg/h via INTRAVENOUS
  Administered 2016-05-29 – 2016-06-01 (×9): 10 mg/h via INTRAVENOUS
  Filled 2016-05-26 (×2): qty 10
  Filled 2016-05-26: qty 20
  Filled 2016-05-26 (×5): qty 10
  Filled 2016-05-26: qty 20
  Filled 2016-05-26: qty 10
  Filled 2016-05-26 (×2): qty 20
  Filled 2016-05-26 (×4): qty 10
  Filled 2016-05-26 (×2): qty 20

## 2016-05-26 MED ORDER — FREE WATER
200.0000 mL | Freq: Four times a day (QID) | Status: DC
  Administered 2016-05-26 – 2016-05-30 (×18): 200 mL

## 2016-05-26 MED ORDER — MIDAZOLAM BOLUS VIA INFUSION
1.0000 mg | INTRAVENOUS | Status: DC | PRN
  Administered 2016-05-27: 2 mg via INTRAVENOUS
  Filled 2016-05-26: qty 2

## 2016-05-26 MED ORDER — PANTOPRAZOLE SODIUM 40 MG IV SOLR
40.0000 mg | Freq: Two times a day (BID) | INTRAVENOUS | Status: DC
  Administered 2016-05-26 – 2016-05-31 (×12): 40 mg via INTRAVENOUS
  Filled 2016-05-26 (×12): qty 40
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
## 2016-05-26 NOTE — Procedures (Signed)
Electroencephalogram report- LTM  Ordering Physician : Rhona Leavensimothy Oster EEG number: [phone]    Beginning date and time: 05/25/2016 10AM Ending date and time:  2016-06-27 10AM  Day of study: 3  Medications include: Pentobarbital  MENTAL STATUS (per technician's notes): Intubated. Sedated. Unresponsive.  HISTORY: This 24 hours of intensive EEG monitoring with simultaneous video monitoring was performed for this patient with unresponsiveness. Patient is now intubated and sedated.  This EEG was requested to rule out subclinical electrographic seizures and monitor for pharmacological induced burst suppression coma.  TECHNICAL DESCRIPTION:  The study consists of a continuous 16-channel multi-montage digital video EEG recording with twenty-one electrodes placed according to the International 10-20 System. Additional leads included eye leads, true temporal leads (T1, T2), and an EKG lead. Activation procedures were not done due to mental status.  REPORT:  The background activity consists of bursts of burst suppression pattern with 2-3 seconds of burst followed by 10-15 seconds of suppression. Overnight, the amplitude and the duration of the burst decreased with about 1-2 seconds of bursts after 20-25 seconds of suppression. This improved towards the end of the recording returning to longer bursts lasting 2-5 seconds. No electrographic seizures were seen.  There were no pushbutton activations events during this recording.   INTERPRETATION: This is an abnormal EEG due to: 1) Burst suppression pattern  Clinical Correlation: This 24 hours of continuous EEG monitoring with simultaneous video monitoring preformed  for this patient who is intubated and sedated is c/w burst/suppression pattern. No significant change from previous day's EEG.

## 2016-05-26 NOTE — Progress Notes (Signed)
Dr. Jordan LikesPool notified regarding patient's increased ICP. No new orders besides decrease stimulation and keep IVC open. Will continue to monitor.

## 2016-05-26 NOTE — Progress Notes (Signed)
Patient remains paralyzed and sedated. He has some response to light in his left eye, his right eye is not as responsive. Further exam precluded by paralytic.  His EEG continues to be in a burst suppression pattern. Defer to neurosurgery for further titration of pentobarbital for ICP. We'll continue to follow along for EEG.  Ritta SlotMcNeill Heidee Audi, MD [facility] [phone]  If 7pm- 7am, please page neurology on call as listed in [facility].

## 2016-05-26 NOTE — Progress Notes (Signed)
Pt seen and examined. No issues overnight.  ICPs better on pentobarb.  EXAM: Temp:  [97.9 F (36.6 C)-101.3 F (38.5 C)] 98.4 F (36.9 C) (01/29 0700) Pulse Rate:  [93-110] 96 (01/29 0728) Resp:  [27-30] 30 (01/29 0728) BP: (106-134)/(51-76) 115/70 (01/29 0728) SpO2:  [91 %-98 %] 94 % (01/29 0728) Arterial Line BP: (86-139)/(43-63) 95/53 (01/29 0600) FiO2 (%):  [60 %-80 %] 60 % (01/29 0728) Weight:  [89.5 kg (197 lb 5 oz)] 89.5 kg (197 lb 5 oz) (01/29 0413) Intake/Output      01/28 0701 - 01/29 0700 01/29 0701 - 01/30 0700   I.V. (mL/kg) 2648.8 (29.6)    NG/GT 2200    IV Piggyback 910    Total Intake(mL/kg) 5758.8 (64.3)    Urine (mL/kg/hr) 1635 (0.8)    Emesis/NG output 120 (0.1)    Drains 181 (0.1)    Total Output 1936     Net +3822.8           Intubated, sedated Pupils not reactive ICPs < 20 Wean pentobarb if ICPs tolerate (remain <20)

## 2016-05-26 NOTE — Progress Notes (Signed)
Follow up - Trauma and Critical Care  Patient Details:    Jimmy [name] is an 26 y.o. male.  Lines/tubes : Airway 7.5 mm (Active)  Secured at (cm) 26 cm 05/11/2016  7:28 AM  Measured From Lips 05/17/2016  7:28 AM  Secured Location Left 05/02/2016  7:28 AM  Secured By [employer] 05/21/2016  7:28 AM  Tube Holder Repositioned Yes 05/02/2016  7:28 AM  Cuff Pressure (cm H2O) 24 cm H2O 05/23/2016 11:37 PM  Site Condition Dry 05/05/2016  7:28 AM     PICC Triple Lumen 05/21/16 Right Brachial 41 cm 0 cm (Active)  Indication for Insertion or Continuance of Line Administration of hyperosmolar/irritating solutions (i.e. TPN, Vancomycin, etc.) 05/28/2016  8:00 AM  Exposed Catheter (cm) 0 cm 05/25/2016  8:00 PM  Site Assessment Clean;Dry;Intact 05/17/2016  8:00 AM  Lumen #1 Status Infusing 05/20/2016  8:00 AM  Lumen #2 Status Infusing 05/25/2016  8:00 AM  Lumen #3 Status Infusing 05/17/2016  8:00 AM  Dressing Type Transparent 05/08/2016  8:00 AM  Dressing Status Clean;Dry;Intact;Antimicrobial disc in place 05/24/2016  8:00 AM  Line Care Lumen 1 tubing changed 05/25/2016  8:00 PM  Dressing Change Due 05/28/16 05/25/2016  9:00 AM     Arterial Line 05/21/16 Left Radial (Active)  Site Assessment Clean;Dry;Intact 05/19/2016  8:00 AM  Line Status Pulsatile blood flow 05/12/2016  8:00 AM  Art Line Waveform Appropriate 05/10/2016  8:00 AM  Art Line Interventions Zeroed and calibrated 05/25/2016  8:00 PM  Color/Movement/Sensation Capillary refill less than 3 sec 05/17/2016  8:00 AM  Dressing Type Transparent 05/13/2016  8:00 AM  Dressing Status Clean;Dry;Intact;Antimicrobial disc in place 05/11/2016  8:00 AM  Dressing Change Due 05/28/16 05/25/2016  8:00 PM     NG/OG Tube Orogastric 16 Fr. Center mouth Xray;Aucultation (Active)  Site Assessment Clean;Intact 05/05/2016  8:00 AM  Ongoing Placement Verification No change in cm markings or external length of tube from initial placement;No acute changes, not  attributed to clinical condition 05/18/2016  8:00 AM  Status Infusing tube feed 05/12/2016  8:00 AM  Amount of suction 90 mmHg 05/24/2016  8:00 PM  Drainage Appearance Brown 05/23/2016  8:00 PM  Intake (mL) 100 mL 05/23/2016  8:00 PM  Output (mL) 120 mL 05/25/2016 10:00 AM     Urethral Catheter [name] RN Temperature probe;Non-latex 16 Fr. (Active)  Indication for Insertion or Continuance of Catheter Chemically paralyzed patients 05/21/2016  8:00 AM  Site Assessment Clean;Intact;Dry 05/25/2016  8:00 PM  Catheter Maintenance Bag below level of bladder;Catheter secured;Drainage bag/tubing not touching floor;Insertion date on drainage bag;No dependent loops;Seal intact 05/25/2016  8:00 PM  Collection Container Standard drainage bag 05/25/2016  8:00 PM  Securement Method Securing device (Describe) 05/25/2016  8:00 PM  Urinary Catheter Interventions Unclamped 05/25/2016  8:00 PM  Output (mL) 135 mL 04/30/2016  9:00 AM     ICP/Ventriculostomy Ventricular drainage catheter with ICP monitoring Right (Active)  Drain Status Open 05/25/2016  8:00 PM  Level 10 cm 05/25/2016  8:00 PM  Status Open to continuous drainage 05/25/2016  8:00 PM  CSF Color Clear 05/25/2016  8:00 PM  Site Assessment Clean;Dry 05/25/2016  8:00 PM  Dressing Status Clean;Dry;Intact 05/25/2016  8:00 PM  Dressing Intervention Dressing reinforced 05/24/2016  8:00 PM  Dressing Change Due 05/28/16 05/24/2016  8:00 PM  Output (mL) 8 mL 04/28/2016  9:00 AM    Microbiology/Sepsis markers: Results for orders placed or performed during the hospital encounter of 05/16/2016  MRSA  PCR Screening     Status: None   Collection Time: 05/20/16  1:02 AM  Result Value Ref Range Status   MRSA by PCR NEGATIVE NEGATIVE Final    Comment:        The GeneXpert MRSA Assay (FDA approved for NASAL specimens only), is one component of a comprehensive MRSA colonization surveillance program. It is not intended to diagnose MRSA infection nor to guide or monitor  treatment for MRSA infections.   Culture, respiratory (NON-Expectorated)     Status: None (Preliminary result)   Collection Time: 05/24/16  9:05 PM  Result Value Ref Range Status   Specimen Description TRACHEAL ASPIRATE  Final   Special Requests NONE  Final   Gram Stain   Final    ABUNDANT WBC PRESENT,BOTH PMN AND MONONUCLEAR FEW GRAM NEGATIVE RODS FEW GRAM POSITIVE COCCI    Culture TOO YOUNG TO READ  Final   Report Status PENDING  Incomplete    Anti-infectives:  Anti-infectives    Start     Dose/Rate Route Frequency Ordered Stop   05/25/16 0800  vancomycin (VANCOCIN) IVPB 1000 mg/200 mL premix     1,000 mg 200 mL/hr over 60 Minutes Intravenous Every 8 hours 05/25/16 0636     05/22/16 1430  vancomycin (VANCOCIN) 1,250 mg in sodium chloride 0.9 % 250 mL IVPB  Status:  Discontinued     1,250 mg 166.7 mL/hr over 90 Minutes Intravenous Every 8 hours 05/22/16 1420 05/25/16 0636   05/20/16 0600  vancomycin (VANCOCIN) IVPB 750 mg/150 ml premix  Status:  Discontinued     750 mg 150 mL/hr over 60 Minutes Intravenous Every 8 hours 05/04/2016 2044 05/22/16 1420   05/04/2016 2100  cefTRIAXone (ROCEPHIN) 2 g in dextrose 5 % 50 mL IVPB     2 g 100 mL/hr over 30 Minutes Intravenous Every 12 hours 05/26/2016 2039     05/20/2016 2045  vancomycin (VANCOCIN) 1,500 mg in sodium chloride 0.9 % 500 mL IVPB     1,500 mg 250 mL/hr over 120 Minutes Intravenous  Once 05/03/2016 2043 05/20/16 0018      Best Practice/Protocols:  VTE Prophylaxis: Mechanical GI Prophylaxis: Proton Pump Inhibitor Continous Sedation No new problems  Consults: Treatment Team:  Loura Halt Ditty, MD    Events:  Subjective:    Overnight Issues: High ICP, hypernatremia  Objective:  Vital signs for last 24 hours: Temp:  [97.9 F (36.6 C)-101.3 F (38.5 C)] 98.8 F (37.1 C) (01/29 0900) Pulse Rate:  [93-108] 99 (01/29 1000) Resp:  [27-30] 30 (01/29 1000) BP: (108-134)/(60-76) 119/69 (01/29 1000) SpO2:  [91 %-98  %] 94 % (01/29 1000) Arterial Line BP: (86-139)/(46-63) 95/48 (01/29 1000) FiO2 (%):  [60 %-70 %] 60 % (01/29 1000) Weight:  [89.5 kg (197 lb 5 oz)] 89.5 kg (197 lb 5 oz) (01/29 0413)  Hemodynamic parameters for last 24 hours:    Intake/Output from previous day: 01/28 0701 - 01/29 0700 In: 5758.8 [I.V.:2648.8; NG/GT:2200; IV Piggyback:910] Out: 1936 [Urine:1635; Emesis/NG output:120; Drains:181]  Intake/Output this shift: Total I/O In: 1050.4 [I.V.:325.4; NG/GT:420; IV Piggyback:305] Out: 271 [Urine:255; Drains:16]  Vent settings for last 24 hours: Vent Mode: PRVC FiO2 (%):  [60 %-70 %] 60 % Set Rate:  [30 bmp] 30 bmp Vt Set:  [420 mL] 420 mL PEEP:  [14 cmH20] 14 cmH20 Plateau Pressure:  [29 cmH20-31 cmH20] 30 cmH20  Physical Exam:  General: no respiratory distress and paralyzed and sedated Neuro: RASS -3 or deeper and paralyzed and sedated  Resp: diminished breath sounds bibasilar and bilaterally and rhonchi bibasilar and bilaterally CVS: Sinus tachycardia GI: soft, nontender, BS WNL, no r/g and Tolerating tube feedings. Extremities: edema 4+  Results for orders placed or performed during the hospital encounter of June 02, 2016 (from the past 24 hour(s))  Glucose, capillary     Status: Abnormal   Collection Time: 05/25/16 11:53 AM  Result Value Ref Range   Glucose-Capillary 130 (H) 65 - 99 mg/dL   Comment 1 Notify RN    Comment 2 Document in Chart   Sodium     Status: Abnormal   Collection Time: 05/25/16  2:00 PM  Result Value Ref Range   Sodium 161 (HH) 135 - 145 mmol/L  Glucose, capillary     Status: Abnormal   Collection Time: 05/25/16  3:55 PM  Result Value Ref Range   Glucose-Capillary 146 (H) 65 - 99 mg/dL   Comment 1 Notify RN    Comment 2 Document in Chart   Glucose, capillary     Status: Abnormal   Collection Time: 05/25/16  7:48 PM  Result Value Ref Range   Glucose-Capillary 118 (H) 65 - 99 mg/dL  Sodium     Status: Abnormal   Collection Time: 05/25/16   8:00 PM  Result Value Ref Range   Sodium 157 (H) 135 - 145 mmol/L  Glucose, capillary     Status: Abnormal   Collection Time: 05/03/2016 12:02 AM  Result Value Ref Range   Glucose-Capillary 131 (H) 65 - 99 mg/dL  Sodium     Status: Abnormal   Collection Time: 05/02/2016  2:20 AM  Result Value Ref Range   Sodium 157 (H) 135 - 145 mmol/L  Glucose, capillary     Status: Abnormal   Collection Time: 05/11/2016  3:31 AM  Result Value Ref Range   Glucose-Capillary 126 (H) 65 - 99 mg/dL  Blood gas, arterial     Status: Abnormal   Collection Time: 05/18/2016  4:10 AM  Result Value Ref Range   FIO2 30.00    Delivery systems VENTILATOR    Mode PRESSURE REGULATED VOLUME CONTROL    VT 420 mL   LHR 30 resp/min   Peep/cpap 14.0 cm H20   pH, Arterial 7.313 (L) 7.350 - 7.450   pCO2 arterial 48.7 (H) 32.0 - 48.0 mmHg   pO2, Arterial 77.0 (L) 83.0 - 108.0 mmHg   Bicarbonate 23.9 20.0 - 28.0 mmol/L   Acid-base deficit 1.4 0.0 - 2.0 mmol/L   O2 Saturation 93.7 %   Patient temperature 99.0    Collection site A-LINE    Drawn by [phone]    Sample type ARTERIAL DRAW    Allens test (pass/fail) PASS PASS  CBC     Status: Abnormal   Collection Time: 05/14/2016  5:00 AM  Result Value Ref Range   WBC 8.1 4.0 - 10.5 K/uL   RBC 2.51 (L) 4.22 - 5.81 MIL/uL   Hemoglobin 7.3 (L) 13.0 - 17.0 g/dL   HCT 60.4 (L) 54.0 - 98.1 %   MCV 93.2 78.0 - 100.0 fL   MCH 29.1 26.0 - 34.0 pg   MCHC 31.2 30.0 - 36.0 g/dL   RDW 19.1 47.8 - 29.5 %   Platelets 210 150 - 400 K/uL  Basic metabolic panel     Status: Abnormal   Collection Time: 05/19/2016  5:00 AM  Result Value Ref Range   Sodium 157 (H) 135 - 145 mmol/L   Potassium 3.4 (L) 3.5 - 5.1 mmol/L  Chloride 127 (H) 101 - 111 mmol/L   CO2 25 22 - 32 mmol/L   Glucose, Bld 123 (H) 65 - 99 mg/dL   BUN 23 (H) 6 - 20 mg/dL   Creatinine, Ser 9.811.40 (H) 0.61 - 1.24 mg/dL   Calcium 8.0 (L) 8.9 - 10.3 mg/dL   GFR calc non Af Amer >60 >60 mL/min   GFR calc Af Amer >60 >60 mL/min    Anion gap 5 5 - 15  Triglycerides     Status: Abnormal   Collection Time: November 11, 2016  5:00 AM  Result Value Ref Range   Triglycerides 306 (H) <150 mg/dL  Sodium     Status: Abnormal   Collection Time: November 11, 2016  7:57 AM  Result Value Ref Range   Sodium 156 (H) 135 - 145 mmol/L  Glucose, capillary     Status: Abnormal   Collection Time: November 11, 2016  8:07 AM  Result Value Ref Range   Glucose-Capillary 129 (H) 65 - 99 mg/dL   Comment 1 Notify RN    Comment 2 Document in Chart      Assessment/Plan:   NEURO  Altered Mental Status:  coma, sedation and paralyzed and sedated Trauma-CNS:  depressed level of consciousness, coma, intracranial injury, increased intracranial pressure and fracture of skull   Plan: Continue management with pentobarbital and sedation and paralytic  PULM  Atelectasis/collapse (bibasilar) Likely pneumonia, but cultures are pending.  On empiric ceftriaxone   Plan: Wean vent settings as tolerated.  CARDIO  Sinus Tachycardia   Plan: No specific  RENAL  elevated creatinine with hypervolemia Hypervolemia   Plan: Lasix one dose  GI  No specific issues   Plan: Continue tube feedings.  ID  Pneumonia (cultures pending, on empiric antibiotic currentl;y)   Plan: CPM  HEME  Anemia acute blood loss anemia and anemia of critical illness)   Plan: No blood transfusion for now.  ENDO No specific issues except for hypernatremia   Plan: CPM, will still give some free water for hypernatremia  Global Issues  Free water.  Await cultures Wean pentobarbital, ABG.  Will paln for family conference for Wednesday, but he will not be able to go much further without T/G in the relatively near future    LOS: 7 days   Additional comments:I reviewed the patient's new clinical lab test results. cbc/bmet/abg and I reviewed the patients new imaging test results. cxr  Critical Care Total Time*: 30 Minutes  Jimmy [name] 05-21-2016  *Care during the described time interval was provided by  me and/or other providers on the critical care team.  I have reviewed this patient's available data, including medical history, events of note, physical examination and test results as part of my evaluation.

## 2016-05-26 NOTE — Progress Notes (Signed)
LTM EEG continues.  Impedances checked, CZ, P3, A1, reset.  No skin breakdown noted.

## 2016-05-27 ENCOUNTER — Inpatient Hospital Stay (HOSPITAL_COMMUNITY): Payer: Medicaid Other

## 2016-05-27 LAB — CBC
HCT: 24.2 % — ABNORMAL LOW (ref 39.0–52.0)
Hemoglobin: 7.7 g/dL — ABNORMAL LOW (ref 13.0–17.0)
MCH: 29.2 pg (ref 26.0–34.0)
MCHC: 31.8 g/dL (ref 30.0–36.0)
MCV: 91.7 fL (ref 78.0–100.0)
PLATELETS: 228 10*3/uL (ref 150–400)
RBC: 2.64 MIL/uL — ABNORMAL LOW (ref 4.22–5.81)
RDW: 15 % (ref 11.5–15.5)
WBC: 6.8 10*3/uL (ref 4.0–10.5)

## 2016-05-27 LAB — BASIC METABOLIC PANEL
ANION GAP: 9 (ref 5–15)
BUN: 26 mg/dL — ABNORMAL HIGH (ref 6–20)
CALCIUM: 8.3 mg/dL — AB (ref 8.9–10.3)
CO2: 25 mmol/L (ref 22–32)
Chloride: 123 mmol/L — ABNORMAL HIGH (ref 101–111)
Creatinine, Ser: 1.54 mg/dL — ABNORMAL HIGH (ref 0.61–1.24)
GFR calc Af Amer: >60 mL/min (ref >60)
Glucose, Bld: 118 mg/dL — ABNORMAL HIGH (ref 65–99)
Potassium: 3.7 mmol/L (ref 3.5–5.1)
Sodium: 157 mmol/L — ABNORMAL HIGH (ref 135–145)

## 2016-05-27 LAB — GLUCOSE, CAPILLARY
GLUCOSE-CAPILLARY: 114 mg/dL — AB (ref 65–99)
GLUCOSE-CAPILLARY: 119 mg/dL — AB (ref 65–99)
GLUCOSE-CAPILLARY: 130 mg/dL — AB (ref 65–99)
Glucose-Capillary: 115 mg/dL — ABNORMAL HIGH (ref 65–99)
Glucose-Capillary: 128 mg/dL — ABNORMAL HIGH (ref 65–99)
Glucose-Capillary: 142 mg/dL — ABNORMAL HIGH (ref 65–99)

## 2016-05-27 LAB — VANCOMYCIN, TROUGH: VANCOMYCIN TR: 27 ug/mL — AB (ref 15–20)

## 2016-05-27 LAB — BLOOD GAS, ARTERIAL
ACID-BASE EXCESS: 1.1 mmol/L (ref 0.0–2.0)
Bicarbonate: 26.9 mmol/L (ref 20.0–28.0)
Drawn by: 39899
FIO2: 100
LHR: 30 {breaths}/min
O2 SAT: 92.9 %
PATIENT TEMPERATURE: 98.6
PCO2 ART: 57.4 mmHg — AB (ref 32.0–48.0)
PEEP/CPAP: 16 cmH2O
PO2 ART: 76.2 mmHg — AB (ref 83.0–108.0)
VT: 420 mL
pH, Arterial: 7.293 — ABNORMAL LOW (ref 7.350–7.450)

## 2016-05-27 LAB — CULTURE, RESPIRATORY

## 2016-05-27 LAB — PREPARE RBC (CROSSMATCH)

## 2016-05-27 LAB — CULTURE, RESPIRATORY W GRAM STAIN: Culture: NO GROWTH

## 2016-05-27 LAB — SODIUM
SODIUM: 151 mmol/L — AB (ref 135–145)
Sodium: 154 mmol/L — ABNORMAL HIGH (ref 135–145)
Sodium: 153 mmol/L — ABNORMAL HIGH (ref 135–145)

## 2016-05-27 MED ORDER — PIVOT 1.5 CAL PO LIQD
1000.0000 mL | ORAL | Status: DC
  Administered 2016-05-27 – 2016-05-30 (×4): 1000 mL

## 2016-05-27 MED ORDER — VANCOMYCIN HCL IN DEXTROSE 1-5 GM/200ML-% IV SOLN
1000.0000 mg | Freq: Two times a day (BID) | INTRAVENOUS | Status: DC
  Administered 2016-05-27 – 2016-05-31 (×9): 1000 mg via INTRAVENOUS
  Filled 2016-05-27 (×10): qty 200

## 2016-05-27 MED ORDER — SODIUM CHLORIDE 0.9 % IV SOLN: Freq: Once | INTRAVENOUS | Status: AC

## 2016-05-27 MED ORDER — PRO-STAT SUGAR FREE PO LIQD
30.0000 mL | Freq: Every day | ORAL | Status: DC
  Administered 2016-05-28 – 2016-05-31 (×4): 30 mL
  Filled 2016-05-27 (×4): qty 30

## 2016-05-27 MED ORDER — SODIUM CHLORIDE 0.9 % IV SOLN
Freq: Once | INTRAVENOUS | Status: AC
  Administered 2016-05-27: 09:00:00 via INTRAVENOUS

## 2016-05-27 NOTE — Procedures (Signed)
Electroencephalogram report- LTM  Ordering Physician : Rhona Leavensimothy Oster EEG number: [phone]    Beginning date and time: 05/15/2016 10AM Ending date and time:  05/27/2016 10AM  Day of study: 4  Medications include: Pentobarbital  MENTAL STATUS (per technician's notes): Intubated. Sedated. Unresponsive.  HISTORY: This 24 hours of intensive EEG monitoring with simultaneous video monitoring was performed for this patient with unresponsiveness. Patient is now intubated and sedated.  This EEG was requested to rule out subclinical electrographic seizures and monitor for pharmacological induced burst suppression coma.  TECHNICAL DESCRIPTION:  The study consists of a continuous 16-channel multi-montage digital video EEG recording with twenty-one electrodes placed according to the International 10-20 System. Additional leads included eye leads, true temporal leads (T1, T2), and an EKG lead. Activation procedures were not done due to mental status.  REPORT:  The background activity consists of bursts of burst suppression pattern with 2-3 seconds of burst followed by 10-15 seconds of suppression. For a period of time during later part of the day and portion of the night on 05/14/2016, the suppression duration decreased with 2-5 seconds of suppression followed by burst. This increased towards the end of the reporting period. No electrographic seizures were seen.  There were no pushbutton activations events during this recording.   INTERPRETATION: This is an abnormal EEG due to: 1) Burst suppression pattern  Clinical Correlation: This 24 hours of continuous EEG monitoring with simultaneous video monitoring preformed  for this patient who is intubated and sedated is c/w burst/suppression pattern.

## 2016-05-27 NOTE — Progress Notes (Signed)
LTM continues.  Electrodes checked and reset as needed. No skin break down noted. 
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
## 2016-05-27 NOTE — Progress Notes (Signed)
CRITICAL VALUE ALERT  Critical value received:  Vancomycin Trough - 27  Date of notification:  05/27/2016  Time of notification:  1621  Critical value read back:Yes.    Nurse who received alert:  Vianne Bullsich Burlene Montecalvo, RN  MD notified (1st page):  Dr. Janee Mornhompson, MD  Time of first page:  N/A MD on unit  MD notified (2nd page):  Time of second page:  Responding MD:  Dr. Janee Mornhompson, MD  Time MD responded:  N/A MD on unit, pharmacy called and told RN to hold 1600 dose

## 2016-05-27 NOTE — Progress Notes (Signed)
Pt seen and examined. ICPs worsened since pentobarb d/c'd  EXAM: Temp:  [98.1 F (36.7 C)-101.1 F (38.4 C)] 99 F (37.2 C) (01/30 1000) Pulse Rate:  [102-112] 109 (01/30 1000) Resp:  [22-30] 30 (01/30 1000) BP: (111-147)/(56-90) 147/90 (01/30 1000) SpO2:  [88 %-96 %] 89 % (01/30 1000) Arterial Line BP: (106-131)/(50-69) 130/69 (01/30 1000) FiO2 (%):  [60 %-80 %] 80 % (01/30 1000) Weight:  [84.1 kg (185 lb 6.5 oz)] 84.1 kg (185 lb 6.5 oz) (01/30 0404) Intake/Output      01/29 0701 - 01/30 0700 01/30 0701 - 01/31 0700   I.V. (mL/kg) 1725.8 (20.5)    Blood  30   NG/GT 1860    IV Piggyback 910    Total Intake(mL/kg) 4495.8 (53.5) 30 (0.4)   Urine (mL/kg/hr) 2310 (1.1)    Emesis/NG output 440 (0.2)    Drains 173 (0.1) 23 (0.1)   Stool 0 (0)    Total Output 2923 23   Net +1572.8 +7        Stool Occurrence 1 x     Intubated and sedated Pupils fixed and dilated No movement  Re-initiate pentobarb Poor prognosis; will discuss with family when they arrive today

## 2016-05-27 NOTE — Progress Notes (Signed)
Patient ID: Jimmy [name], male   DOB: 1990-09-14, 26 y.o.   MRN: [id number] Dr. Cyndy Freeze and I met with his parents and other family to update them. We explained how his neurologic status has deteriorated despite maximal efforts. I also discussed his worsening ARDS and requirement for maximal ventilatory support. I also performed bronchoscopy today. I advised them he may not survive the night. Plan goals of care meeting tomorrow with Dr. Hulen Skains as scheduled. We began that discussion today as well including the fact that his lungs are too bad to undergo trach at this time. We discussed the best case scenario of disposition now being SNF +/- vent.They remain hopeful for his recovery and want everything done.  Georganna Skeans, MD, MPH, FACS Trauma: [phone] General Surgery: [phone]

## 2016-05-27 NOTE — Procedures (Signed)
Bronchoscopy Procedure Note Jimmy [name] [id number] 1990-05-20  Procedure: Bronchoscopy Indications: Remove secretions  Procedure Details Consent: Risks of procedure as well as the alternatives and risks of each were explained to the (patient/caregiver).  Consent for procedure obtained. Time Out: Verified patient identification, verified procedure, site/side was marked, verified correct patient position, special equipment/implants available, medications/allergies/relevent history reviewed, required imaging and test results available.  Performed  In preparation for procedure, patient was given 100% FiO2. Sedation: pentobarb coma  Airway entered and the following bronchi were examined: RUL, RML, RLL, LUL and LLL.   Procedures performed: Brushings performed Bronchoscope removed.    Evaluation Hemodynamic Status: BP stable throughout; O2 sats: 90+ Patient's Current Condition: unstable Specimens:  Sent purulent fluid Complications: No apparent complications ICPs transiently elevated.   [name] E 05/27/2016

## 2016-05-27 NOTE — Progress Notes (Signed)
Called Dr. Bevely Palmeritty regarding patient's increased ICP to 32-33 and pupils fixed and dilated. Told Dr Bevely Palmeritty pentobarb had been stopped at 0545 this morning.  He said to restart pentobarb and titrate to get ICPs below 25. A bolus dose of 5mg /kg/hr over 30 minutes was given. Will continue to monitor and update MD on patient status. Lakelyn Straus C 05/27/16

## 2016-05-27 NOTE — Progress Notes (Signed)
He has had increasing ICPs again today, restarted on pentobarbital. Today, his pupils are essentially non-reactive bilaterally, though I might have seen a minimal flicker of response. No response to nox stim. EEG with burst suppression. Defer to nsgy for ICP control.   Ritta SlotMcNeill Hania Cerone, MD [facility] [phone]  If 7pm- 7am, please page neurology on call as listed in AMION.
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
## 2016-05-28 ENCOUNTER — Inpatient Hospital Stay (HOSPITAL_COMMUNITY): Payer: Medicaid Other

## 2016-05-28 DIAGNOSIS — J9600 Acute respiratory failure, unspecified whether with hypoxia or hypercapnia: Secondary | ICD-10-CM

## 2016-05-28 DIAGNOSIS — S299XXA Unspecified injury of thorax, initial encounter: Secondary | ICD-10-CM

## 2016-05-28 LAB — BASIC METABOLIC PANEL
ANION GAP: 5 (ref 5–15)
BUN: 29 mg/dL — ABNORMAL HIGH (ref 6–20)
CALCIUM: 8.2 mg/dL — AB (ref 8.9–10.3)
CHLORIDE: 119 mmol/L — AB (ref 101–111)
CO2: 27 mmol/L (ref 22–32)
CREATININE: 1.63 mg/dL — AB (ref 0.61–1.24)
GFR calc Af Amer: >60 mL/min (ref >60)
GFR calc non Af Amer: 58 mL/min — ABNORMAL LOW (ref >60)
GLUCOSE: 116 mg/dL — AB (ref 65–99)
Potassium: 3.4 mmol/L — ABNORMAL LOW (ref 3.5–5.1)
Sodium: 151 mmol/L — ABNORMAL HIGH (ref 135–145)

## 2016-05-28 LAB — BLOOD GAS, ARTERIAL
ACID-BASE EXCESS: 1.1 mmol/L (ref 0.0–2.0)
Acid-Base Excess: 2.2 mmol/L — ABNORMAL HIGH (ref 0.0–2.0)
BICARBONATE: 28.1 mmol/L — AB (ref 20.0–28.0)
Bicarbonate: 26.6 mmol/L (ref 20.0–28.0)
Drawn by: 418751
FIO2: 100
FIO2: 100
MECHVT: 480 mL
O2 Saturation: 97 %
O2 Saturation: 90.5 %
PATIENT TEMPERATURE: 98.6
PEEP/CPAP: 18 cmH2O
PEEP/CPAP: 24 cmH2O
PH ART: 7.272 — AB (ref 7.350–7.450)
Patient temperature: 101.4
RATE: 30 resp/min
RATE: 35 resp/min
VT: 420 mL
pCO2 arterial: 53.2 mmHg — ABNORMAL HIGH (ref 32.0–48.0)
pCO2 arterial: 64.4 mmHg — ABNORMAL HIGH (ref 32.0–48.0)
pH, Arterial: 7.319 — ABNORMAL LOW (ref 7.350–7.450)
pO2, Arterial: 106 mmHg (ref 83.0–108.0)
pO2, Arterial: 75.7 mmHg — ABNORMAL LOW (ref 83.0–108.0)

## 2016-05-28 LAB — TYPE AND SCREEN
ABO/RH(D): AB POS
Antibody Screen: NEGATIVE
Unit division: 0

## 2016-05-28 LAB — POCT I-STAT 3, ART BLOOD GAS (G3+)
ACID-BASE EXCESS: 2 mmol/L (ref 0.0–2.0)
ACID-BASE EXCESS: 1 mmol/L (ref 0.0–2.0)
Acid-Base Excess: 2 mmol/L (ref 0.0–2.0)
BICARBONATE: 28 mmol/L (ref 20.0–28.0)
BICARBONATE: 28.8 mmol/L — AB (ref 20.0–28.0)
Bicarbonate: 28.1 mmol/L — ABNORMAL HIGH (ref 20.0–28.0)
O2 SAT: 87 %
O2 SAT: 83 %
O2 SAT: 91 %
PCO2 ART: 60.5 mmHg — AB (ref 32.0–48.0)
TCO2: 30 mmol/L (ref 0–100)
TCO2: 31 mmol/L (ref 0–100)
TCO2: 30 mmol/L (ref 0–100)
pCO2 arterial: 54.1 mmHg — ABNORMAL HIGH (ref 32.0–48.0)
pCO2 arterial: 65.9 mmHg (ref 32.0–48.0)
pH, Arterial: 7.322 — ABNORMAL LOW (ref 7.350–7.450)
pH, Arterial: 7.248 — ABNORMAL LOW (ref 7.350–7.450)
pH, Arterial: 7.283 — ABNORMAL LOW (ref 7.350–7.450)
pO2, Arterial: 58 mmHg — ABNORMAL LOW (ref 83.0–108.0)
pO2, Arterial: 57 mmHg — ABNORMAL LOW (ref 83.0–108.0)
pO2, Arterial: 76 mmHg — ABNORMAL LOW (ref 83.0–108.0)

## 2016-05-28 LAB — CBC
HEMATOCRIT: 22.5 % — AB (ref 39.0–52.0)
HEMOGLOBIN: 7.2 g/dL — AB (ref 13.0–17.0)
MCH: 29.1 pg (ref 26.0–34.0)
MCHC: 32 g/dL (ref 30.0–36.0)
MCV: 91.1 fL (ref 78.0–100.0)
Platelets: 216 10*3/uL (ref 150–400)
RBC: 2.47 MIL/uL — ABNORMAL LOW (ref 4.22–5.81)
RDW: 15.1 % (ref 11.5–15.5)
WBC: 7 10*3/uL (ref 4.0–10.5)

## 2016-05-28 LAB — GLUCOSE, CAPILLARY
GLUCOSE-CAPILLARY: 123 mg/dL — AB (ref 65–99)
Glucose-Capillary: 121 mg/dL — ABNORMAL HIGH (ref 65–99)
Glucose-Capillary: 117 mg/dL — ABNORMAL HIGH (ref 65–99)
Glucose-Capillary: 129 mg/dL — ABNORMAL HIGH (ref 65–99)
Glucose-Capillary: 122 mg/dL — ABNORMAL HIGH (ref 65–99)

## 2016-05-28 LAB — SODIUM
SODIUM: 152 mmol/L — AB (ref 135–145)
Sodium: 152 mmol/L — ABNORMAL HIGH (ref 135–145)

## 2016-05-28 MED ORDER — POTASSIUM CHLORIDE 20 MEQ/15ML (10%) PO SOLN
40.0000 meq | Freq: Once | ORAL | Status: AC
  Administered 2016-05-28: 40 meq
  Filled 2016-05-28: qty 30

## 2016-05-28 MED ORDER — FUROSEMIDE 10 MG/ML IJ SOLN
INTRAMUSCULAR | Status: AC
Start: 2016-05-28 — End: 2016-05-28
  Filled 2016-05-28: qty 2

## 2016-05-28 MED ORDER — FUROSEMIDE 10 MG/ML IJ SOLN
60.0000 mg | Freq: Two times a day (BID) | INTRAMUSCULAR | Status: DC
  Administered 2016-05-28: 60 mg via INTRAVENOUS

## 2016-05-28 MED ORDER — FUROSEMIDE 10 MG/ML IJ SOLN
INTRAMUSCULAR | Status: AC
  Filled 2016-05-28: qty 6

## 2016-05-28 MED ORDER — FUROSEMIDE 10 MG/ML IJ SOLN
80.0000 mg | Freq: Three times a day (TID) | INTRAMUSCULAR | Status: AC
  Administered 2016-05-28 – 2016-05-29 (×3): 80 mg via INTRAVENOUS
  Filled 2016-05-28 (×3): qty 8

## 2016-05-28 MED ORDER — CHLORHEXIDINE GLUCONATE 0.12% ORAL RINSE (MEDLINE KIT)
15.0000 mL | Freq: Two times a day (BID) | OROMUCOSAL | Status: DC
  Administered 2016-05-28 – 2016-05-31 (×7): 15 mL via OROMUCOSAL

## 2016-05-28 MED ORDER — ORAL CARE MOUTH RINSE: 15.0000 mL | OROMUCOSAL | Status: DC

## 2016-05-28 MED ORDER — FUROSEMIDE 10 MG/ML IJ SOLN
80.0000 mg | Freq: Two times a day (BID) | INTRAMUSCULAR | Status: DC
  Administered 2016-05-28: 20 mg via INTRAVENOUS

## 2016-05-28 NOTE — Progress Notes (Signed)
Family meeting held today with mother, father and grandmother attending.  MD, CSW, RNCM and bedside RNs in attendance.  MD provided updates on pt's respiratory decline and continued neurological issues.  MD made family aware that pt will not be able to live off of ventilator and will need to be in a facility to be cared for the rest of his life.  They were also made aware [place].  Family appropriate, yet devastated regarding prognosis.  Both father and mother voice appreciation for all of the care given to their son.  Family would like to discuss pt prognosis with the rest of the family and let medical staff know when decision is reached about trach/PEG vs comfort care.  Mother did voice that pt "would not want to live like this."  Support given to family.  Will be available as needed.    Quintella BatonJulie W. Tacara Hadlock, RN, BSN  Trauma/Neuro ICU Case Manager [phone]

## 2016-05-28 NOTE — Progress Notes (Signed)
Dr. Bevely Palmeritty notified regarding patients increased ICP and increased HR. Patient is straight in the bed with HOB > 30, decreased stimulation promoted. No new orders given. Per Dr. Bevely Palmeritty, discontinue order to notify for increased ICP. Will continue monitor patient closely throughout the night.

## 2016-05-28 NOTE — Procedures (Signed)
Vent settings changed at this time per MD due to ABG results

## 2016-05-28 NOTE — Clinical Social Work Note (Signed)
Clinical Social Worker, RNCM, RN, and MD present in family meeting including, patient mother, father, and grandmother.  Patient family appropriately upset regarding patient steady medical decline, but appreciative for honesty and support.  Patient parents plan to communicate with patient siblings and will decide between aggressive care vs. Comfort care.  Patient family aware that patient will not improve greatly with continued aggressive care.  Patient mother made the comment, "Jimmy [name], would not want to live this way."  CSW available for support as needed to patient family and staff.  Macario GoldsJesse Shelanda [place], [place] [phone]

## 2016-05-28 NOTE — Progress Notes (Signed)
Follow up - Trauma and Critical Care  Patient Details:    Jimmy [name] is an 26 y.o. male.  Lines/tubes : Airway 7.5 mm (Active)  Secured at (cm) 26 cm 05/28/2016  8:36 AM  Measured From Lips 05/28/2016  8:36 AM  Secured Location Left 05/28/2016  8:36 AM  Secured By [employer] 05/28/2016  8:36 AM  Tube Holder Repositioned Yes 05/28/2016  8:36 AM  Cuff Pressure (cm H2O) 28 cm H2O 05/28/2016  3:10 AM  Site Condition Dry 05/28/2016  8:36 AM     PICC Triple Lumen 05/21/16 Right Brachial 41 cm 0 cm (Active)  Indication for Insertion or Continuance of Line Administration of hyperosmolar/irritating solutions (i.e. TPN, Vancomycin, etc.) 05/27/2016  8:00 PM  Exposed Catheter (cm) 0 cm 05/25/2016  8:00 PM  Site Assessment Clean;Dry;Intact 05/27/2016  8:00 PM  Lumen #1 Status Infusing 05/27/2016  8:00 PM  Lumen #2 Status Infusing 05/27/2016  8:00 PM  Lumen #3 Status Infusing 05/27/2016  8:00 PM  Dressing Type Transparent 05/27/2016  8:00 PM  Dressing Status Clean;Dry;Intact;Antimicrobial disc in place 05/27/2016  8:00 PM  Line Care Connections checked and tightened 05/27/2016  8:00 PM  Dressing Change Due 05/28/16 05/27/2016  8:00 PM     Arterial Line 05/21/16 Left Radial (Active)  Site Assessment Clean;Dry;Intact 05/27/2016  8:00 PM  Line Status Pulsatile blood flow 05/27/2016  8:00 PM  Art Line Waveform Appropriate 05/27/2016  8:00 PM  Art Line Interventions Zeroed and calibrated;Leveled 05/27/2016  8:00 PM  Color/Movement/Sensation Capillary refill less than 3 sec 05/27/2016  8:00 PM  Dressing Type Transparent;Occlusive 05/27/2016  8:00 PM  Dressing Status Clean;Dry;Intact;Antimicrobial disc in place 05/27/2016  8:00 PM  Dressing Change Due 05/28/16 05/27/2016  8:00 PM     NG/OG Tube Orogastric 16 Fr. Center mouth Xray;Aucultation (Active)  Site Assessment Clean;Intact 05/27/2016  8:00 PM  Ongoing Placement Verification No change in cm markings or external length of tube from initial  placement;No acute changes, not attributed to clinical condition 05/27/2016  8:00 PM  Status Infusing tube feed 05/27/2016  8:00 PM  Amount of suction 90 mmHg 05/24/2016  8:00 PM  Drainage Appearance Brown 05/23/2016  8:00 PM  Intake (mL) 100 mL 05/23/2016  8:00 PM  Output (mL) 120 mL 05/25/2016 10:00 AM     Urethral Catheter [name] RN Temperature probe;Non-latex 16 Fr. (Active)  Indication for Insertion or Continuance of Catheter Chemically paralyzed patients 05/27/2016  8:00 PM  Site Assessment Clean;Intact;Dry 05/27/2016  8:00 PM  Catheter Maintenance Bag below level of bladder;Catheter secured;Drainage bag/tubing not touching floor;Insertion date on drainage bag;No dependent loops 05/27/2016  8:00 PM  Collection Container Standard drainage bag 05/27/2016  8:00 PM  Securement Method Securing device (Describe) 05/27/2016  8:00 PM  Urinary Catheter Interventions Unclamped 05/27/2016  8:00 PM  Output (mL) 100 mL 05/28/2016 11:00 AM     ICP/Ventriculostomy Ventricular drainage catheter with ICP monitoring Right (Active)  Drain Status Open 05/27/2016  8:00 PM  Level 10 cm 05/27/2016  8:00 PM  Status Open to continuous drainage 05/27/2016  8:00 PM  CSF Color Clear 05/27/2016  8:00 PM  Site Assessment Clean;Dry 05/27/2016  8:00 PM  Dressing Status Clean;Dry;Intact 05/27/2016  8:00 PM  Dressing Intervention Dressing reinforced 05/24/2016  8:00 PM  Dressing Change Due 05/28/16 05/24/2016  8:00 PM  Output (mL) 8 mL 05/28/2016 11:00 AM    Microbiology/Sepsis markers: Results for orders placed or performed during the hospital encounter of 05/03/2016  MRSA PCR Screening  Status: None   Collection Time: 05/20/16  1:02 AM  Result Value Ref Range Status   MRSA by PCR NEGATIVE NEGATIVE Final    Comment:        The GeneXpert MRSA Assay (FDA approved for NASAL specimens only), is one component of a comprehensive MRSA colonization surveillance program. It is not intended to diagnose MRSA infection nor to guide  or monitor treatment for MRSA infections.   Culture, respiratory (NON-Expectorated)     Status: None   Collection Time: 05/24/16  9:05 PM  Result Value Ref Range Status   Specimen Description TRACHEAL ASPIRATE  Final   Special Requests NONE  Final   Gram Stain   Final    ABUNDANT WBC PRESENT,BOTH PMN AND MONONUCLEAR FEW GRAM NEGATIVE RODS FEW GRAM POSITIVE COCCI    Culture NO GROWTH 2 DAYS  Final   Report Status 05/27/2016 FINAL  Final  Culture, blood (routine x 2)     Status: None (Preliminary result)   Collection Time: 05/24/16 11:47 PM  Result Value Ref Range Status   Specimen Description BLOOD LEFT HAND  Final   Special Requests IN PEDIATRIC BOTTLE 0.5CC  Final   Culture NO GROWTH 2 DAYS  Final   Report Status PENDING  Incomplete  Culture, blood (routine x 2)     Status: None (Preliminary result)   Collection Time: 05/24/16 11:55 PM  Result Value Ref Range Status   Specimen Description BLOOD LEFT HAND  Final   Special Requests IN PEDIATRIC BOTTLE 3CC  Final   Culture NO GROWTH 2 DAYS  Final   Report Status PENDING  Incomplete  Culture, bal-quantitative     Status: None (Preliminary result)   Collection Time: 05/27/16  1:30 PM  Result Value Ref Range Status   Specimen Description BRONCHIAL ALVEOLAR LAVAGE  Final   Special Requests Normal  Final   Gram Stain   Final    ABUNDANT WBC PRESENT, PREDOMINANTLY PMN NO ORGANISMS SEEN    Culture NO GROWTH < 24 HOURS  Final   Report Status PENDING  Incomplete    Anti-infectives:  Anti-infectives    Start     Dose/Rate Route Frequency Ordered Stop   05/27/16 2200  vancomycin (VANCOCIN) IVPB 1000 mg/200 mL premix     1,000 mg 200 mL/hr over 60 Minutes Intravenous Every 12 hours 05/27/16 1630     05/25/16 0800  vancomycin (VANCOCIN) IVPB 1000 mg/200 mL premix  Status:  Discontinued     1,000 mg 200 mL/hr over 60 Minutes Intravenous Every 8 hours 05/25/16 0636 05/27/16 1626   05/22/16 1430  vancomycin (VANCOCIN) 1,250 mg in  sodium chloride 0.9 % 250 mL IVPB  Status:  Discontinued     1,250 mg 166.7 mL/hr over 90 Minutes Intravenous Every 8 hours 05/22/16 1420 05/25/16 0636   05/20/16 0600  vancomycin (VANCOCIN) IVPB 750 mg/150 ml premix  Status:  Discontinued     750 mg 150 mL/hr over 60 Minutes Intravenous Every 8 hours 05/07/2016 2044 05/22/16 1420   05/04/2016 2100  cefTRIAXone (ROCEPHIN) 2 g in dextrose 5 % 50 mL IVPB     2 g 100 mL/hr over 30 Minutes Intravenous Every 12 hours 05/07/2016 2039     04/30/2016 2045  vancomycin (VANCOCIN) 1,500 mg in sodium chloride 0.9 % 500 mL IVPB     1,500 mg 250 mL/hr over 120 Minutes Intravenous  Once 05/13/2016 2043 05/20/16 0018      Best Practice/Protocols:  VTE Prophylaxis: Mechanical GI Prophylaxis:  Proton Pump Inhibitor Continous Sedation Paralytic and pentobarbital coma being managed by neurosurgery  Consults: Treatment Team:  Kevan Ny Ditty, MD    Events:  Subjective:    Overnight Issues: Severe hypoxemia.  FIO2 100%, PEEP +18  Objective:  Vital signs for last 24 hours: Temp:  [99.3 F (37.4 C)-101.1 F (38.4 C)] 99.9 F (37.7 C) (01/31 1100) Pulse Rate:  [109-123] 114 (01/31 1100) Resp:  [30] 30 (01/31 1100) BP: (113-161)/(55-92) 119/72 (01/31 1100) SpO2:  [91 %-98 %] 91 % (01/31 1100) Arterial Line BP: (108-131)/(59-71) 124/70 (01/31 1100) FiO2 (%):  [100 %] 100 % (01/31 1100) Weight:  [88.8 kg (195 lb 12.3 oz)] 88.8 kg (195 lb 12.3 oz) (01/31 0500)  Hemodynamic parameters for last 24 hours:    Intake/Output from previous day: 01/30 0701 - 01/31 0700 In: 5540.5 [I.V.:2620.5; Blood:335; ML/YY:5035; IV [id number]:170] Out: 2337 [Urine:2150; Drains:187]  Intake/Output this shift: Total I/O In: 1233 [I.V.:438; NG/GT:440; IV Piggyback:355] Out: 429 [Urine:400; Drains:29]  Vent settings for last 24 hours: Vent Mode: PRVC FiO2 (%):  [100 %] 100 % Set Rate:  [30 bmp] 30 bmp Vt Set:  [480 mL] 480 mL PEEP:  [18 cmH20] 18 cmH20 Plateau  Pressure:  [36 cmH20-40 cmH20] 38 cmH20  Physical Exam:  General: no respiratory distress and paralyzed and sedated Neuro: RASS -3 or deeper and Pupils are 15m and non-reactive Resp: rhonchi bibasilar, bilaterally and CXR with ARDS pattern and severe basilar atelectasis CVS: Sinus tachycardia GI: Tolerating tube feedings. Extremities: edema 4+  Results for orders placed or performed during the hospital encounter of 04/28/2016 (from the past 24 hour(s))  Glucose, capillary     Status: Abnormal   Collection Time: 05/27/16 11:39 AM  Result Value Ref Range   Glucose-Capillary 115 (H) 65 - 99 mg/dL   Comment 1 Notify RN    Comment 2 Document in Chart   Blood gas, arterial     Status: Abnormal   Collection Time: 05/27/16 12:20 PM  Result Value Ref Range   FIO2 100.00    Delivery systems VENTILATOR    Mode PRESSURE REGULATED VOLUME CONTROL    VT 420 mL   LHR 30 resp/min   Peep/cpap 16.0 cm H20   pH, Arterial 7.293 (L) 7.350 - 7.450   pCO2 arterial 57.4 (H) 32.0 - 48.0 mmHg   pO2, Arterial 76.2 (L) 83.0 - 108.0 mmHg   Bicarbonate 26.9 20.0 - 28.0 mmol/L   Acid-Base Excess 1.1 0.0 - 2.0 mmol/L   O2 Saturation 92.9 %   Patient temperature 98.6    Collection site A-LINE    Drawn by [phone]   Sample type ARTERIAL   Culture, bal-quantitative     Status: None (Preliminary result)   Collection Time: 05/27/16  1:30 PM  Result Value Ref Range   Specimen Description BRONCHIAL ALVEOLAR LAVAGE    Special Requests Normal    Gram Stain      ABUNDANT WBC PRESENT, PREDOMINANTLY PMN NO ORGANISMS SEEN    Culture NO GROWTH < 24 HOURS    Report Status PENDING   CBC     Status: Abnormal   Collection Time: 05/27/16  1:38 PM  Result Value Ref Range   WBC 6.8 4.0 - 10.5 K/uL   RBC 2.64 (L) 4.22 - 5.81 MIL/uL   Hemoglobin 7.7 (L) 13.0 - 17.0 g/dL   HCT 24.2 (L) 39.0 - 52.0 %   MCV 91.7 78.0 - 100.0 fL   MCH 29.2 26.0 - 34.0 pg  MCHC 31.8 30.0 - 36.0 g/dL   RDW 15.0 11.5 - 15.5 %   Platelets  228 150 - 400 K/uL  Sodium     Status: Abnormal   Collection Time: 05/27/16  1:41 PM  Result Value Ref Range   Sodium 153 (H) 135 - 145 mmol/L  Vancomycin, trough     Status: Abnormal   Collection Time: 05/27/16  3:19 PM  Result Value Ref Range   Vancomycin Tr 27 (HH) 15 - 20 ug/mL  Glucose, capillary     Status: Abnormal   Collection Time: 05/27/16  4:14 PM  Result Value Ref Range   Glucose-Capillary 128 (H) 65 - 99 mg/dL   Comment 1 Notify RN    Comment 2 Document in Chart   Sodium     Status: Abnormal   Collection Time: 05/27/16  8:00 PM  Result Value Ref Range   Sodium 151 (H) 135 - 145 mmol/L  Glucose, capillary     Status: Abnormal   Collection Time: 05/27/16  8:21 PM  Result Value Ref Range   Glucose-Capillary 130 (H) 65 - 99 mg/dL  Glucose, capillary     Status: Abnormal   Collection Time: 05/27/16 11:23 PM  Result Value Ref Range   Glucose-Capillary 142 (H) 65 - 99 mg/dL  Sodium     Status: Abnormal   Collection Time: 05/28/16  1:55 AM  Result Value Ref Range   Sodium 152 (H) 135 - 145 mmol/L  Glucose, capillary     Status: Abnormal   Collection Time: 05/28/16  3:31 AM  Result Value Ref Range   Glucose-Capillary 121 (H) 65 - 99 mg/dL  Blood gas, arterial     Status: Abnormal   Collection Time: 05/28/16  4:10 AM  Result Value Ref Range   FIO2 100.00    Delivery systems VENTILATOR    Mode PRESSURE REGULATED VOLUME CONTROL    VT 480 mL   LHR 30 resp/min   Peep/cpap 18.0 cm H20   pH, Arterial 7.319 (L) 7.350 - 7.450   pCO2 arterial 53.2 (H) 32.0 - 48.0 mmHg   pO2, Arterial 106 83.0 - 108.0 mmHg   Bicarbonate 26.6 20.0 - 28.0 mmol/L   Acid-Base Excess 1.1 0.0 - 2.0 mmol/L   O2 Saturation 97.0 %   Patient temperature 98.6    Collection site A-LINE    Drawn by [id number]    Sample type ARTERIAL DRAW    Allens test (pass/fail) PASS PASS  CBC     Status: Abnormal   Collection Time: 05/28/16  5:33 AM  Result Value Ref Range   WBC 7.0 4.0 - 10.5 K/uL   RBC 2.47  (L) 4.22 - 5.81 MIL/uL   Hemoglobin 7.2 (L) 13.0 - 17.0 g/dL   HCT 22.5 (L) 39.0 - 52.0 %   MCV 91.1 78.0 - 100.0 fL   MCH 29.1 26.0 - 34.0 pg   MCHC 32.0 30.0 - 36.0 g/dL   RDW 15.1 11.5 - 15.5 %   Platelets 216 150 - 400 K/uL  Basic metabolic panel     Status: Abnormal   Collection Time: 05/28/16  5:33 AM  Result Value Ref Range   Sodium 151 (H) 135 - 145 mmol/L   Potassium 3.4 (L) 3.5 - 5.1 mmol/L   Chloride 119 (H) 101 - 111 mmol/L   CO2 27 22 - 32 mmol/L   Glucose, Bld 116 (H) 65 - 99 mg/dL   BUN 29 (H) 6 - 20 mg/dL  Creatinine, Ser 1.63 (H) 0.61 - 1.24 mg/dL   Calcium 8.2 (L) 8.9 - 10.3 mg/dL   GFR calc non Af Amer 58 (L) >60 mL/min   GFR calc Af Amer >60 >60 mL/min   Anion gap 5 5 - 15  Sodium     Status: Abnormal   Collection Time: 05/28/16  7:51 AM  Result Value Ref Range   Sodium 152 (H) 135 - 145 mmol/L  Glucose, capillary     Status: Abnormal   Collection Time: 05/28/16  8:34 AM  Result Value Ref Range   Glucose-Capillary 117 (H) 65 - 99 mg/dL   Comment 1 Notify RN    Comment 2 Document in Chart      Assessment/Plan:   NEURO  Altered Mental Status:  paralyzed and sedated   Plan: Pentopbarbital being managed by neurology service.  ICPs not really being managed.  Neurosurgeon not really concerned about ICPs  PULM  ARDS (non-pulmonary etiology An protocol)   Plan: CPM  CARDIO  Sinus Tachycardia   Plan: No specific treatment  RENAL  Adequate.  No DI   Plan: CPM.  Creatinine is slowly climbing  GI  No specific problems   Plan: Continue tube feedings.  ID  No known infectious source   Plan: CPM  HEME  Anemia acute blood loss anemia and anemia of critical illness)   Plan: Hemoglobin is 7.2.  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  More than likely his pulmonary issues are going  to determine his final outcome.    LOS: 9 days   Additional comments:I reviewed the patient's new clinical lab test results. cbc/bmet and I reviewed the patients new imaging test results. cxr  Critical Care Total Time*: 2 Hours with time to talke with the family included in thei care  Tamiya Colello 05/28/2016  *Care during the described time interval was provided by me and/or other providers on the critical care team.  I have reviewed this patient's available data, including medical history, events of note, physical examination and test results as part of my evaluation.

## 2016-05-28 NOTE — Procedures (Signed)
Electroencephalogram report- LTM  Ordering Physician : Rhona Leavensimothy Oster EEG number: [phone]    Beginning date and time: 05/27/2016 10AM Ending date and time:  05/28/2016 10AM  Day of study: 5  Medications include: Pentobarbital  MENTAL STATUS (per technician's notes): Intubated. Sedated. Unresponsive.  HISTORY: This 24 hours of intensive EEG monitoring with simultaneous video monitoring was performed for this patient with unresponsiveness. Patient is now intubated and sedated.  This EEG was requested to rule out subclinical electrographic seizures and monitor for pharmacological induced burst suppression coma.  TECHNICAL DESCRIPTION:  The study consists of a continuous 16-channel multi-montage digital video EEG recording with twenty-one electrodes placed according to the International 10-20 System. Additional leads included eye leads, true temporal leads (T1, T2), and an EKG lead. Activation procedures were not done due to mental status.  REPORT:  The background activity consists of bursts of burst suppression pattern with 2-3 seconds of burst followed by 10-15 seconds of suppression. No electrographic seizures were seen.  There were no pushbutton activations events during this recording.   INTERPRETATION: This is an abnormal EEG due to: 1) Burst suppression pattern  Clinical Correlation: This 24 hours of continuous EEG monitoring with simultaneous video monitoring preformed  for this patient who is intubated and sedated is c/w burst/suppression pattern. No significant change from previous day's EEG.

## 2016-05-28 NOTE — Progress Notes (Signed)
Pt seen and examined. ICPs remain out of control despite maximum intervention.  EXAM: Temp:  [99 F (37.2 C)-101.1 F (38.4 C)] 99.5 F (37.5 C) (01/31 0900) Pulse Rate:  [109-123] 115 (01/31 0900) Resp:  [30] 30 (01/31 0900) BP: (113-161)/(55-92) 121/64 (01/31 0900) SpO2:  [87 %-98 %] 93 % (01/31 0900) Arterial Line BP: (108-131)/(59-71) 120/66 (01/31 0900) FiO2 (%):  [80 %-100 %] 100 % (01/31 0900) Weight:  [88.8 kg (195 lb 12.3 oz)] 88.8 kg (195 lb 12.3 oz) (01/31 0500) Intake/Output      01/30 0701 - 01/31 0700 01/31 0701 - 02/01 0700   I.V. (mL/kg) 2620.5 (29.5) 227.8 (2.6)   Blood 335    NG/GT 1875 320   IV Piggyback 710    Total Intake(mL/kg) 5540.5 (62.4) 547.8 (6.2)   Urine (mL/kg/hr) 2150 (1) 235 (1)   Drains 187 (0.1) 15 (0.1)   Stool 0 (0)    Total Output 2337 250   Net +3203.5 +297.8        Stool Occurrence 1 x     Intubated, sedated, pupils fixed and dilated ICP > 25  Continue pentobarbital coma for one more day, will d/c tomorrow regardless of ICPs.

## 2016-05-28 NOTE — Procedures (Signed)
Dr. Tyson AliasFeinstein at bedside, vent settings changed per MD, will get ABG [id number].

## 2016-05-28 NOTE — Progress Notes (Signed)
Continues to have high ICPs. On exam, he continues to have fixed and dilated pupils, no response to noxious stimuli. EEG appears to have fewer bursts today. Will decrease pentobarbital.   Ritta SlotMcNeill Cosmo Tetreault, MD [facility] [phone]  If 7pm- 7am, please page neurology on call as listed in AMION.
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
## 2016-05-29 ENCOUNTER — Inpatient Hospital Stay (HOSPITAL_COMMUNITY): Payer: Medicaid Other

## 2016-05-29 DIAGNOSIS — R609 Edema, unspecified: Secondary | ICD-10-CM

## 2016-05-29 LAB — BASIC METABOLIC PANEL
ANION GAP: 9 (ref 5–15)
ANION GAP: 12 (ref 5–15)
BUN: 35 mg/dL — ABNORMAL HIGH (ref 6–20)
BUN: 39 mg/dL — AB (ref 6–20)
CALCIUM: 8.4 mg/dL — AB (ref 8.9–10.3)
CHLORIDE: 117 mmol/L — AB (ref 101–111)
CO2: 27 mmol/L (ref 22–32)
CO2: 28 mmol/L (ref 22–32)
Calcium: 8.5 mg/dL — ABNORMAL LOW (ref 8.9–10.3)
Chloride: 112 mmol/L — ABNORMAL HIGH (ref 101–111)
Creatinine, Ser: 1.8 mg/dL — ABNORMAL HIGH (ref 0.61–1.24)
Creatinine, Ser: 1.88 mg/dL — ABNORMAL HIGH (ref 0.61–1.24)
GFR calc Af Amer: 60 mL/min — ABNORMAL LOW (ref >60)
GFR calc Af Amer: 57 mL/min — ABNORMAL LOW (ref >60)
GFR calc non Af Amer: 52 mL/min — ABNORMAL LOW (ref >60)
GFR calc non Af Amer: 49 mL/min — ABNORMAL LOW (ref >60)
GLUCOSE: 132 mg/dL — AB (ref 65–99)
GLUCOSE: 110 mg/dL — AB (ref 65–99)
POTASSIUM: 4.3 mmol/L (ref 3.5–5.1)
Potassium: 4.6 mmol/L (ref 3.5–5.1)
Sodium: 153 mmol/L — ABNORMAL HIGH (ref 135–145)
Sodium: 152 mmol/L — ABNORMAL HIGH (ref 135–145)

## 2016-05-29 LAB — CBC
HCT: 21.9 % — ABNORMAL LOW (ref 39.0–52.0)
Hemoglobin: 7.1 g/dL — ABNORMAL LOW (ref 13.0–17.0)
MCH: 29.5 pg (ref 26.0–34.0)
MCHC: 32.4 g/dL (ref 30.0–36.0)
MCV: 90.9 fL (ref 78.0–100.0)
Platelets: 210 10*3/uL (ref 150–400)
RBC: 2.41 MIL/uL — AB (ref 4.22–5.81)
RDW: 15.4 % (ref 11.5–15.5)
WBC: 9 10*3/uL (ref 4.0–10.5)

## 2016-05-29 LAB — GLUCOSE, CAPILLARY
GLUCOSE-CAPILLARY: 119 mg/dL — AB (ref 65–99)
GLUCOSE-CAPILLARY: 128 mg/dL — AB (ref 65–99)
Glucose-Capillary: 116 mg/dL — ABNORMAL HIGH (ref 65–99)
Glucose-Capillary: 110 mg/dL — ABNORMAL HIGH (ref 65–99)
Glucose-Capillary: 119 mg/dL — ABNORMAL HIGH (ref 65–99)
Glucose-Capillary: 150 mg/dL — ABNORMAL HIGH (ref 65–99)
Glucose-Capillary: 118 mg/dL — ABNORMAL HIGH (ref 65–99)

## 2016-05-29 LAB — TRIGLYCERIDES: Triglycerides: 203 mg/dL — ABNORMAL HIGH (ref <150)
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
## 2016-05-29 NOTE — Progress Notes (Signed)
LTM EEG discontinued.  Electrodes removed , pt's scalp cleansed.  No skin breakdown noted.

## 2016-05-29 NOTE — Progress Notes (Signed)
Pentobarbitol being weaned to off. Once this is stopped, can d/c EEG. On exam pupils remain fixed, but this is confounded by pentobarbitol and nimbex. EEG does still reveal cortical activity in the form of burst suppression. Neurology will sign off, please call with further questions or concerns.   Ritta SlotMcNeill Bartow Zylstra, MD [facility] [phone]  If 7pm- 7am, please page neurology on call as listed in AMION.

## 2016-05-29 NOTE — Progress Notes (Addendum)
*  PRELIMINARY RESULTS* Vascular Ultrasound Upper extremity venous duplex has been completed.  Preliminary findings: DVT noted in the right axillary vein and a single brachial vein (surrounding PICC). Superficial thrombosis noted in the right basilic vein. No evidence of DVT or superficial thrombosis in the left upper extremity.    [facility] and Dr. Tyson AliasFeinstein.    Farrel DemarkJill Eunice, RDMS, RVT  05/29/2016, 10:23 AM

## 2016-05-29 NOTE — Progress Notes (Signed)
Patient ID: Jimmy [name], male   DOB: 04-12-1991, 26 y.o.   MRN: [id number] I spoke with his mother. Continue DNR. The family is considering taking him off the vent but not tonight. Jimmy [name] Jimmy Krotzer, MD, MPH, FACS Trauma: [phone] General Surgery: [phone]

## 2016-05-29 NOTE — Procedures (Signed)
Electroencephalogram report- LTM  Ordering Physician : Rhona Leavensimothy Oster EEG number: [phone]    Beginning date and time: 05/28/2016 10AM Ending date and time:  05/29/2016 10AM  Day of study: 6  Medications include: Pentobarbital  MENTAL STATUS (per technician's notes): Intubated. Sedated. Unresponsive.  HISTORY: This 24 hours of intensive EEG monitoring with simultaneous video monitoring was performed for this patient with unresponsiveness. Patient is now intubated and sedated.  This EEG was requested to rule out subclinical electrographic seizures and monitor for pharmacological induced burst suppression coma.  TECHNICAL DESCRIPTION:  The study consists of a continuous 16-channel multi-montage digital video EEG recording with twenty-one electrodes placed according to the International 10-20 System. Additional leads included eye leads, true temporal leads (T1, T2), and an EKG lead. Activation procedures were not done due to mental status.  REPORT:  The background activity consists of bursts of burst suppression pattern with 2-3 seconds of burst followed by 10-15 seconds of suppression. Towards the later part of the recording, the suppression decreased in duration, with about 5-7 seconds of suppression followed by a burst.  No electrographic seizures were seen.  There were no pushbutton activations events during this recording.   INTERPRETATION: This is an abnormal EEG due to: 1) Burst suppression pattern  Clinical Correlation: This 24 hours of continuous EEG monitoring with simultaneous video monitoring preformed  for this patient who is intubated and sedated is c/w burst/suppression pattern. No significant change from previous day's EEG.

## 2016-05-29 NOTE — Progress Notes (Signed)
Pt seen and examined. No issues overnight.  EXAM: Temp:  [99.5 F (37.5 C)-101.5 F (38.6 C)] 100 F (37.8 C) (02/01 0700) Pulse Rate:  [104-129] 106 (02/01 0700) Resp:  [28-38] 38 (02/01 0700) BP: (114-146)/(62-101) 124/80 (02/01 0700) SpO2:  [83 %-98 %] 98 % (02/01 0754) Arterial Line BP: (85-143)/(62-94) 85/82 (02/01 0700) FiO2 (%):  [100 %] 100 % (02/01 0754) Weight:  [88.1 kg (194 lb 3.6 oz)] 88.1 kg (194 lb 3.6 oz) (02/01 0500) Intake/Output      01/31 0701 - 02/01 0700 02/01 0701 - 02/02 0700   I.V. (mL/kg) 2680.3 (30.4)    Blood     NG/GT 2040 200   IV Piggyback 710    Total Intake(mL/kg) 5430.3 (61.6) 200 (2.3)   Urine (mL/kg/hr) 4930 (2.3) 135 (1.1)   Drains 154 (0.1) 6 (0)   Stool     Total Output 5084 141   Net +346.3 +59         Intubated and sedated Pupils fixed and dilated No movement  Poor neurological prognosis Stop pentobarb today Continue paralytics for now
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
## 2016-05-29 DEATH — deceased

## 2016-05-30 ENCOUNTER — Inpatient Hospital Stay (HOSPITAL_COMMUNITY): Payer: Medicaid Other

## 2016-05-30 LAB — CULTURE, BLOOD (ROUTINE X 2)
CULTURE: NO GROWTH
Culture: NO GROWTH

## 2016-05-30 LAB — CBC
HCT: 21.4 % — ABNORMAL LOW (ref 39.0–52.0)
HCT: 22.9 % — ABNORMAL LOW (ref 39.0–52.0)
Hemoglobin: 6.9 g/dL — CL (ref 13.0–17.0)
Hemoglobin: 7.3 g/dL — ABNORMAL LOW (ref 13.0–17.0)
MCH: 29.7 pg (ref 26.0–34.0)
MCH: 28.9 pg (ref 26.0–34.0)
MCHC: 32.2 g/dL (ref 30.0–36.0)
MCHC: 31.9 g/dL (ref 30.0–36.0)
MCV: 92.2 fL (ref 78.0–100.0)
MCV: 90.5 fL (ref 78.0–100.0)
PLATELETS: 203 10*3/uL (ref 150–400)
Platelets: 224 K/uL (ref 150–400)
RBC: 2.32 MIL/uL — ABNORMAL LOW (ref 4.22–5.81)
RBC: 2.53 MIL/uL — ABNORMAL LOW (ref 4.22–5.81)
RDW: 15.6 % — ABNORMAL HIGH (ref 11.5–15.5)
RDW: 15 % (ref 11.5–15.5)
WBC: 6.4 K/uL (ref 4.0–10.5)
WBC: 10.4 10*3/uL (ref 4.0–10.5)

## 2016-05-30 LAB — CULTURE, BAL-QUANTITATIVE W GRAM STAIN: Culture: 4000 — AB

## 2016-05-30 LAB — GLUCOSE, CAPILLARY
GLUCOSE-CAPILLARY: 138 mg/dL — AB (ref 65–99)
Glucose-Capillary: 105 mg/dL — ABNORMAL HIGH (ref 65–99)
Glucose-Capillary: 143 mg/dL — ABNORMAL HIGH (ref 65–99)
Glucose-Capillary: 117 mg/dL — ABNORMAL HIGH (ref 65–99)
Glucose-Capillary: 137 mg/dL — ABNORMAL HIGH (ref 65–99)

## 2016-05-30 LAB — CULTURE, BAL-QUANTITATIVE: SPECIAL REQUESTS: NORMAL

## 2016-05-30 LAB — BASIC METABOLIC PANEL
Anion gap: 14 (ref 5–15)
BUN: 46 mg/dL — AB (ref 6–20)
CALCIUM: 8.5 mg/dL — AB (ref 8.9–10.3)
CO2: 28 mmol/L (ref 22–32)
CREATININE: 1.56 mg/dL — AB (ref 0.61–1.24)
Chloride: 109 mmol/L (ref 101–111)
GFR calc Af Amer: >60 mL/min (ref >60)
GLUCOSE: 112 mg/dL — AB (ref 65–99)
POTASSIUM: 4.2 mmol/L (ref 3.5–5.1)
Sodium: 151 mmol/L — ABNORMAL HIGH (ref 135–145)

## 2016-05-30 LAB — VANCOMYCIN, TROUGH: Vancomycin Tr: 18 ug/mL (ref 15–20)

## 2016-05-30 NOTE — Progress Notes (Addendum)
2113 - pt's O2 sats decreased between 82-84% - RN suctioned pt and gave 100% O2 breaths per ventilator in attempt to increase w/o success. Notified RRT of pt situation and she attempted multiple options to increase O2 sats - suction, lavage, reposition head.  2116 - notified on-call trauma MD of pt situation. RN given orders to obtain stat ABG (via RRT) and stat portable CXR.  Upon results of both interventions, no further orders were given. (critical pCO2 - 65.2 read to MD while on phone) 2145 - noticed pt's abdomen taut, firm and distended. Notified on-call trauma MD a second time to assess if pt was retaining tube feeds - once OG applied to LIWS, emptied 1075mL w/in 3 minutes. Verbal order given via trauma to maintain OG to LIWS to decompress stomach. Pt hasn't had BM since 05/21/2016. By 2225, pt's O2 sats increased to 90% and were slowly continuing to improve.   Will continue to monitor closely.  Francia GreavesSavannah R Vesna Kable, RN

## 2016-05-30 NOTE — Progress Notes (Signed)
Pt seen and examined. No issues overnight.  EXAM: Temp:  [97.3 F (36.3 C)-103.3 F (39.6 C)] 98.8 F (37.1 C) (02/02 0900) Pulse Rate:  [83-125] 88 (02/02 0900) Resp:  [38] 38 (02/02 0900) BP: (126-165)/(66-109) 158/108 (02/02 0900) SpO2:  [95 %-100 %] 98 % (02/02 0900) Arterial Line BP: (121-177)/(80-118) 177/89 (02/02 0900) FiO2 (%):  [100 %] 100 % (02/02 0750) Weight:  [90 kg (198 lb 6.6 oz)] 90 kg (198 lb 6.6 oz) (02/02 0500) Intake/Output      02/01 0701 - 02/02 0700 02/02 0701 - 02/03 0700   I.V. (mL/kg) 2403.6 (26.7) 195.8 (2.2)   NG/GT 2100 180   IV Piggyback 605    Total Intake(mL/kg) 5108.6 (56.8) 375.8 (4.2)   Urine (mL/kg/hr) 5080 (2.4)    Drains 147 (0.1) 11 (0)   Total Output 5227 11   Net -118.4 +364.8         Intubated, sedated, paralyzed PERRL No movement ICPs >30   ICPs high, will remain high Poor prognosis Ok to stop paralytics if not needed for his pulmonary care

## 2016-05-30 NOTE — Progress Notes (Signed)
Visited w/ family member in rm, providing spiritual/emotional support and prayer -- which she much appreciated. Let her know chaplain was available for f/u.    05/30/16 1100  Clinical Encounter Type  Visited With Patient and family together  Visit Type Follow-up;Psychological support;Spiritual support;Social support;Critical Care  Referral From Chaplain  Spiritual Encounters  Spiritual Needs Prayer;Emotional  Stress Factors  Patient Stress Factors Health changes;Loss of control  Family Stress Factors Family relationships;Health changes;Loss of control   [name], [address]

## 2016-05-30 NOTE — Progress Notes (Signed)
Patient ID: Jimmy [name], male   DOB: 1990/09/13, 26 y.o.   MRN: [id number]  Patient with worsening O2 sats.  CCM signed off yesterday given patient on maximum resp support and they had nothing further to offer.  CXR worse with edema and possible superimposed pneumonia.  ABG worse. Will at least discuss with CCM to see if there is anything else to do.  Patient is already DNR and is not expected to survive given my earlier discussion with Dr. Lindie SpruceWyatt.
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
## 2016-05-30 NOTE — Progress Notes (Signed)
Trauma Service Note  Subjective: High ICP's, BP high also.  Objective: Vital signs in last 24 hours: Temp:  [97.3 F (36.3 C)-103.3 F (39.6 C)] 98.8 F (37.1 C) (02/02 0900) Pulse Rate:  [83-125] 88 (02/02 0900) Resp:  [38] 38 (02/02 0900) BP: (126-165)/(66-109) 158/108 (02/02 0900) SpO2:  [95 %-100 %] 98 % (02/02 0900) Arterial Line BP: (87-177)/(80-118) 177/89 (02/02 0900) FiO2 (%):  [100 %] 100 % (02/02 0750) Weight:  [90 kg (198 lb 6.6 oz)] 90 kg (198 lb 6.6 oz) (02/02 0500) Last BM Date: 05/27/16  Intake/Output from previous day: 02/01 0701 - 02/02 0700 In: 5108.6 [I.V.:2403.6; NG/GT:2100; IV Piggyback:605] Out: 5227 [Urine:5080; Drains:147] Intake/Output this shift: Total I/O In: 375.8 [I.V.:195.8; NG/GT:180] Out: 11 [Drains:11]  General: Not doing anything, but still chemically paralyzed  Lungs: No changes.  Still on FIO2 100% and PEEP 24.  Sats are 97%  Abd: Soft, good bowel   Extremities: Edematous all over  Neuro: Pupils are now midrange and questionably reactive.  Lab Results: CBC   Recent Labs  05/29/16 0655 05/30/16 0652  WBC 9.0 10.4  HGB 7.1* 7.3*  HCT 21.9* 22.9*  PLT 210 203   BMET  Recent Labs  05/29/16 0655 05/30/16 0652  NA 152* 151*  K 4.3 4.2  CL 112* 109  CO2 28 28  GLUCOSE 110* 112*  BUN 39* 46*  CREATININE 1.88* 1.56*  CALCIUM 8.5* 8.5*   PT/INR No results for input(s): LABPROT, INR in the last 72 hours. ABG  Recent Labs  05/28/16 1630 05/28/16 1753  PHART 7.283* 7.272*  HCO3 28.1* 28.1*    Studies/Results: Dg Chest Port 1 View  Result Date: 05/30/2016 CLINICAL DATA:  ARDS EXAM: PORTABLE CHEST 1 VIEW COMPARISON:  05/28/2016 FINDINGS: Cardiac shadow is stable. Right-sided PICC line, endotracheal tube and nasogastric catheter are again seen and stable. There is been some improved aeration in the bases bilaterally although diffuse central vascular congestion and likely pulmonary edema is now present. No bony  abnormality is seen. IMPRESSION: Improved aeration in the bases bilaterally. There is however new vascular congestion and pulmonary edema noted bilaterally. Electronically Signed   By: Alcide Clever M.D.   On: 05/30/2016 07:33   Dg Chest Portable 1 View  Result Date: 05/28/2016 CLINICAL DATA:  Acute drop in oxygen saturation this afternoon in an intubated patient. EXAM: PORTABLE CHEST 1 VIEW COMPARISON:  Single view of the chest earlier today and 05/27/2016. FINDINGS: Support tubes and lines including the patient's ET tube, project in good position. Left worse than right airspace disease persists without marked change. No pneumothorax is identified. IMPRESSION: Support tubes and lines including the patient's endotracheal tube project in good position. No change in left worse than right airspace disease. Electronically Signed   By: Drusilla Kanner M.D.   On: 05/28/2016 15:49    Anti-infectives: Anti-infectives    Start     Dose/Rate Route Frequency Ordered Stop   05/27/16 2200  vancomycin (VANCOCIN) IVPB 1000 mg/200 mL premix     1,000 mg 200 mL/hr over 60 Minutes Intravenous Every 12 hours 05/27/16 1630     05/25/16 0800  vancomycin (VANCOCIN) IVPB 1000 mg/200 mL premix  Status:  Discontinued     1,000 mg 200 mL/hr over 60 Minutes Intravenous Every 8 hours 05/25/16 0636 05/27/16 1626   05/22/16 1430  vancomycin (VANCOCIN) 1,250 mg in sodium chloride 0.9 % 250 mL IVPB  Status:  Discontinued     1,250 mg 166.7 mL/hr over 90  Minutes Intravenous Every 8 hours 05/22/16 1420 05/25/16 0636   05/20/16 0600  vancomycin (VANCOCIN) IVPB 750 mg/150 ml premix  Status:  Discontinued     750 mg 150 mL/hr over 60 Minutes Intravenous Every 8 hours 05/23/2016 2044 05/22/16 1420   05/08/2016 2100  cefTRIAXone (ROCEPHIN) 2 g in dextrose 5 % 50 mL IVPB     2 g 100 mL/hr over 30 Minutes Intravenous Every 12 hours 05/18/2016 2039     05/07/2016 2045  vancomycin (VANCOCIN) 1,500 mg in sodium chloride 0.9 % 500 mL IVPB      1,500 mg 250 mL/hr over 120 Minutes Intravenous  Once 05/11/2016 2043 05/20/16 0018      Assessment/Plan: s/p Procedure(s): BEDSIDE TRACHEOSTOMY Off pentobarbital, still paralyzed and sedated with versed and fentanyl.   Wonder why we are keeping the Nimbex if we are not actively managing his ICPs.   Will ldiscuss with the neurosurgeon.   LOS: 11 days   Marta LamasJames O. Gae BonWyatt, III, MD, FACS [phone] Trauma Surgeon 05/30/2016

## 2016-05-31 ENCOUNTER — Encounter (HOSPITAL_COMMUNITY): Payer: Self-pay

## 2016-05-31 LAB — GLUCOSE, CAPILLARY
GLUCOSE-CAPILLARY: 101 mg/dL — AB (ref 65–99)
GLUCOSE-CAPILLARY: 117 mg/dL — AB (ref 65–99)
Glucose-Capillary: 101 mg/dL — ABNORMAL HIGH (ref 65–99)
Glucose-Capillary: 103 mg/dL — ABNORMAL HIGH (ref 65–99)
Glucose-Capillary: 107 mg/dL — ABNORMAL HIGH (ref 65–99)
Glucose-Capillary: 115 mg/dL — ABNORMAL HIGH (ref 65–99)
Glucose-Capillary: 98 mg/dL (ref 65–99)

## 2016-05-31 MED ORDER — CHLORHEXIDINE GLUCONATE CLOTH 2 % EX PADS
6.0000 | MEDICATED_PAD | Freq: Every day | CUTANEOUS | Status: DC
  Administered 2016-05-31: 6 via TOPICAL

## 2016-05-31 MED ORDER — BISACODYL 10 MG RE SUPP: 10.0000 mg | Freq: Every day | RECTAL | Status: DC | PRN

## 2016-05-31 MED ORDER — CHLORHEXIDINE GLUCONATE CLOTH 2 % EX PADS: 6.0000 | MEDICATED_PAD | Freq: Every day | CUTANEOUS | Status: DC

## 2016-05-31 MED ORDER — FUROSEMIDE 10 MG/ML IJ SOLN
20.0000 mg | Freq: Once | INTRAMUSCULAR | Status: AC
  Administered 2016-06-01: 20 mg via INTRAVENOUS
  Filled 2016-05-31: qty 2

## 2016-05-31 NOTE — Progress Notes (Signed)
Trauma Service Note  Subjective: desats overnight, tube feeds stopped and decompression for abdominal distention  Objective: Vital signs in last 24 hours: Temp:  [96.8 F (36 C)-102.2 F (39 C)] 99 F (37.2 C) (02/03 0700) Pulse Rate:  [81-117] 92 (02/03 0700) Resp:  [30-38] 38 (02/03 0700) BP: (102-180)/(53-110) 133/87 (02/03 0747) SpO2:  [85 %-96 %] 93 % (02/03 0731) Arterial Line BP: (98-189)/(67-90) 150/67 (02/03 0700) FiO2 (%):  [90 %-100 %] 100 % (02/03 0747) Weight:  [90.6 kg (199 lb 11.8 oz)] 90.6 kg (199 lb 11.8 oz) (02/03 0600) Last BM Date: 05/27/16  Intake/Output from previous day: 02/02 0701 - 02/03 0700 In: 4129.9 [I.V.:2054.9; NG/GT:1160; IV Piggyback:815] Out: 3346 [Urine:2120; Emesis/NG output:1075; Drains:151] Intake/Output this shift: Total I/O In: 82.5 [I.V.:82.5] Out: 242 [Urine:225; Drains:17]  General: sedated  Lungs:   Still on FIO2 100% and PEEP 24.  Sats are 96%  Abd: Soft, og to suction   Extremities: Edematous all over  Lab Results: CBC   Recent Labs  05/29/16 0655 05/30/16 0652  WBC 9.0 10.4  HGB 7.1* 7.3*  HCT 21.9* 22.9*  PLT 210 203   BMET  Recent Labs  05/29/16 0655 05/30/16 0652  NA 152* 151*  K 4.3 4.2  CL 112* 109  CO2 28 28  GLUCOSE 110* 112*  BUN 39* 46*  CREATININE 1.88* 1.56*  CALCIUM 8.5* 8.5*   PT/INR No results for input(s): LABPROT, INR in the last 72 hours. ABG  Recent Labs  05/28/16 1753 05/30/16 2120  PHART 7.272* 7.315*  HCO3 28.1* 32.6*    Studies/Results: Dg Chest Port 1 View  Result Date: 05/30/2016 CLINICAL DATA:  Decreased O2 saturation, acute onset. Initial encounter. EXAM: PORTABLE CHEST 1 VIEW COMPARISON:  Chest radiograph performed earlier today at 5:58 a.m. FINDINGS: The patient's endotracheal tube is seen ending 5-6 cm above the carina. An enteric tube is noted extending below the diaphragm. Worsening patchy bilateral airspace opacification is noted, with underlying vascular  congestion. This likely reflects worsening pulmonary edema. Superimposed pneumonia cannot be excluded. No definite pleural effusion or pneumothorax is seen. A right PICC is noted ending about the proximal SVC. The cardiomediastinal silhouette is normal in size. No acute osseous abnormalities identified. IMPRESSION: 1. Endotracheal tube seen ending 5-6 cm above the carina. 2. Worsening patchy bilateral airspace opacification, with underlying vascular congestion. This likely reflects worsening pulmonary edema. Superimposed pneumonia cannot be excluded. Electronically Signed   By: Roanna Raider M.D.   On: 05/30/2016 21:39   Dg Chest Port 1 View  Result Date: 05/30/2016 CLINICAL DATA:  ARDS EXAM: PORTABLE CHEST 1 VIEW COMPARISON:  05/28/2016 FINDINGS: Cardiac shadow is stable. Right-sided PICC line, endotracheal tube and nasogastric catheter are again seen and stable. There is been some improved aeration in the bases bilaterally although diffuse central vascular congestion and likely pulmonary edema is now present. No bony abnormality is seen. IMPRESSION: Improved aeration in the bases bilaterally. There is however new vascular congestion and pulmonary edema noted bilaterally. Electronically Signed   By: Alcide Clever M.D.   On: 05/30/2016 07:33    Anti-infectives: Anti-infectives    Start     Dose/Rate Route Frequency Ordered Stop   05/27/16 2200  vancomycin (VANCOCIN) IVPB 1000 mg/200 mL premix     1,000 mg 200 mL/hr over 60 Minutes Intravenous Every 12 hours 05/27/16 1630     05/25/16 0800  vancomycin (VANCOCIN) IVPB 1000 mg/200 mL premix  Status:  Discontinued     1,000 mg 200  mL/hr over 60 Minutes Intravenous Every 8 hours 05/25/16 0636 05/27/16 1626   05/22/16 1430  vancomycin (VANCOCIN) 1,250 mg in sodium chloride 0.9 % 250 mL IVPB  Status:  Discontinued     1,250 mg 166.7 mL/hr over 90 Minutes Intravenous Every 8 hours 05/22/16 1420 05/25/16 0636   05/20/16 0600  vancomycin (VANCOCIN) IVPB 750  mg/150 ml premix  Status:  Discontinued     750 mg 150 mL/hr over 60 Minutes Intravenous Every 8 hours 05/09/2016 2044 05/22/16 1420   05/20/2016 2100  cefTRIAXone (ROCEPHIN) 2 g in dextrose 5 % 50 mL IVPB     2 g 100 mL/hr over 30 Minutes Intravenous Every 12 hours 05/18/2016 2039     05/01/2016 2045  vancomycin (VANCOCIN) 1,500 mg in sodium chloride 0.9 % 500 mL IVPB     1,500 mg 250 mL/hr over 120 Minutes Intravenous  Once 05/21/2016 2043 05/20/16 0018      Assessment/Plan: s/p Procedure(s): BEDSIDE TRACHEOSTOMY Off pentobarbital, still paralyzed and sedated with versed and fentanyl.   Paralytics off yesterday. Had some issues with abdominal distention and desats overnight. Still rquiring 100%FiO2. Continue supportive care.    LOS: 12 days   Berna Buehelsea A Quaid Yeakle MD 05/31/2016

## 2016-05-31 NOTE — Progress Notes (Addendum)
Pt's O2 sats decreased to 85 while RRT doing assessment. Maintained for 5 minutes and returned to 90-91% before RRT left room.  On-call trauma MD made aware of pt situation - including that pt is having pink-tinged/red, frothy, thin secretions suctioned from his oral cavity and ETT. Will continue to monitor closely - no new orders given.  Francia GreavesSavannah R Chayim Bialas, RN

## 2016-05-31 NOTE — Progress Notes (Signed)
No change in status. Prognosis remains poor. Will cont EVD drainage.

## 2016-06-01 ENCOUNTER — Inpatient Hospital Stay (HOSPITAL_COMMUNITY): Payer: Medicaid Other

## 2016-06-01 LAB — GLUCOSE, CAPILLARY: GLUCOSE-CAPILLARY: 107 mg/dL — AB (ref 65–99)

## 2016-06-01 LAB — TRIGLYCERIDES: TRIGLYCERIDES: 461 mg/dL — AB (ref <150)

## 2016-06-02 ENCOUNTER — Encounter (HOSPITAL_COMMUNITY): Payer: Self-pay | Admitting: *Deleted

## 2016-06-02 LAB — BLOOD GAS, ARTERIAL
Acid-Base Excess: 6.5 mmol/L — ABNORMAL HIGH (ref 0.0–2.0)
BICARBONATE: 32.6 mmol/L — AB (ref 20.0–28.0)
Drawn by: 437071
FIO2: 100
LHR: 38 {breaths}/min
MECHVT: 420 mL
O2 Saturation: 84.4 %
PATIENT TEMPERATURE: 97.3
PEEP/CPAP: 24 cmH2O
PO2 ART: 56.6 mmHg — AB (ref 83.0–108.0)
pCO2 arterial: 65.2 mmHg (ref 32.0–48.0)
pH, Arterial: 7.315 — ABNORMAL LOW (ref 7.350–7.450)

## 2016-06-26 NOTE — Progress Notes (Signed)
While providing oral care to pt, O2 sats began decreasing. Called another RN into room for assistance and pt's O2 sats continued to decrease into 60's and then to 50's. RRT notified for assistance. Upon continued assessment - pt was not getting appropriate volumes as set via ventilator - took pt off ventilator and began manually bagging w/ ambu bag. Upon arrival of RRT - pt's sats maintaining about 50% and second RRT notified for assistance w/ pt situation. Throughout time attempting to get pt's O2 sats increased, pt was transitioned on and off ventilator in attempt to get back volumes/saturations increased. During this time, pt's HR and BP began to decrease - dopplered femoral pulses and began cycling BP's q2 minutes. By [id number], Dr. Fredricka Bonineonnor declared pt as expired, 2 RN's also listened for heart sounds - none auscultated.   Mother and Father notified as soon as pt's sats began decreasing w/o any improvement. Father immediately answered call - took multiple attempts to get mother on the phone.  After, wasted 90 of versed and 200 of fentanyl w/ Dicie BeamEllie Frazier, RN.  Francia GreavesSavannah R Onesti Bonfiglio, RN
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
## 2016-06-26 NOTE — Progress Notes (Signed)
   Met w/ family (mother) in 3S waiting room.   Mother relatively calm/expressed no immediate needs.  Has support w/her (not - and/or in addition to - patient's father).   If/when further assistance needed, please page on-call chaplain.  - Rev. Woodall MDiv ThM

## 2016-06-26 NOTE — Progress Notes (Signed)
2330 - Pt's sats decreased to 86% for a second time and maintained for over 10 minutes - during that time RRT and RN worked w/ pt to increase sats by increasing HOB, suctioning, etc. Informed on-call trauma MD about current pt situation and was given orders to d/c continuous 0.45% saline gtt, and given 20mg  lasix PIV in attempt to increase sats and decrease fluid in lungs/generalized edema.  Sats currently maintaining at 89% - 0010  Will continue to monitor closely.  Francia GreavesSavannah R Kazim Corrales, RN

## 2016-06-26 NOTE — Progress Notes (Signed)
Pt's O2 sats decreased and maintained at 79%. Informed on-call trauma MD and orders given to do stat portable CXR. Also informed by trauma MD to contact CCM via eLink in attempt to assess for any other interventions to increase O2 sats. Upon speaking with on-call CCM MD - informed that all measurements are beng taken to improve O2 sats and there are no further interventions to implement.  Will continue to monitor closely.  Francia GreavesSavannah R Javier Gell, RN

## 2016-06-26 NOTE — Progress Notes (Signed)
Post-mortem was completed.  Patient was bagged with all lines and tubes left intact.  ME aware and patient was transported to morgue.

## 2016-06-26 NOTE — Progress Notes (Signed)
Patient with progressive pulmonary failure overnight- sats in the high 80's earlier this evening despite maximum support. CXR confirms no pneumothorax and progressive ARDS. On-call CCM contacted to confirm no other available measures. This morning he has significantly rapidly  decompensated- worsening desaturations, brady to 30's, hypotensive with systolic ranging 16'X-09U30's-60s. Temp 98.6, on warmer. Despite maximum supportive care the patient has expired at 6:55 this morning. His mother and his father are on the way to the hospital.

## 2016-06-26 DEATH — deceased

## 2017-04-20 IMAGING — CR DG CHEST 1V PORT
2 series · 2 of 2 positions shown · non-contrast
Comparison: 05/25/2016

CLINICAL DATA: ARDS.

EXAM:
PORTABLE CHEST 1 VIEW

[AP (1 of 2)]
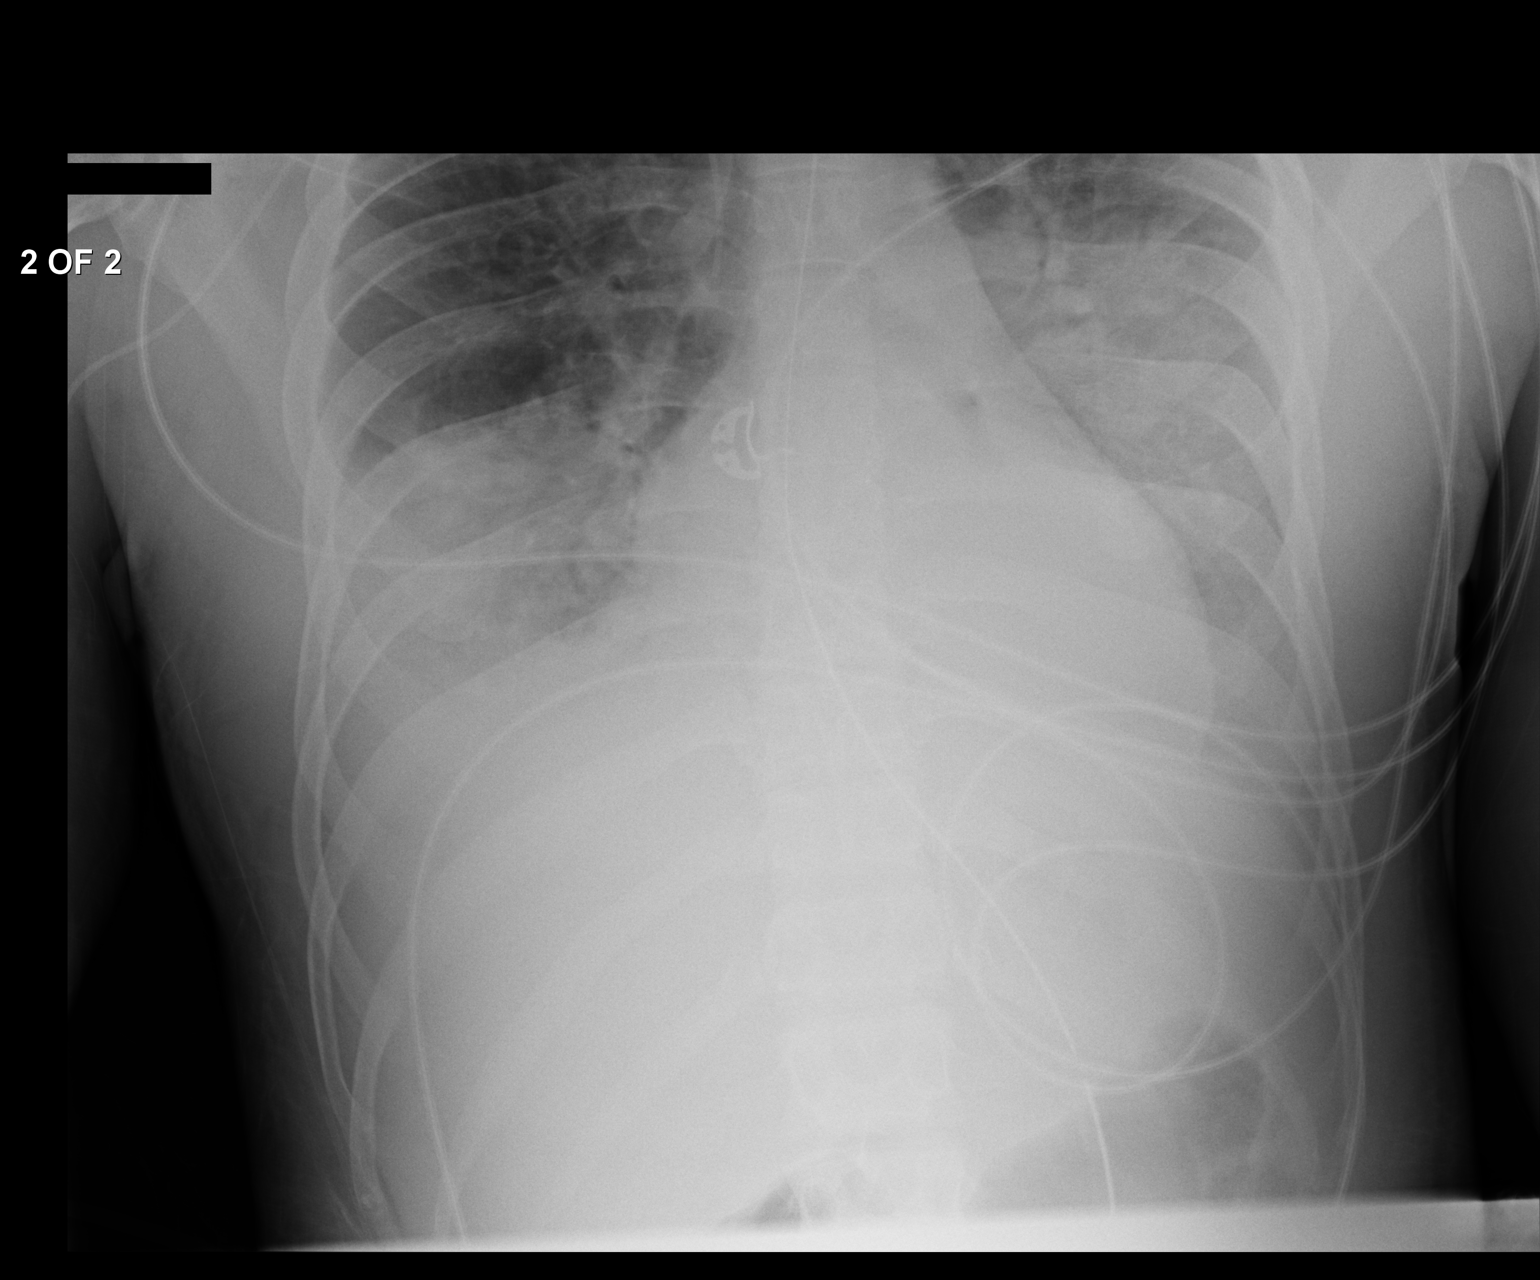

[AP (2 of 2)]
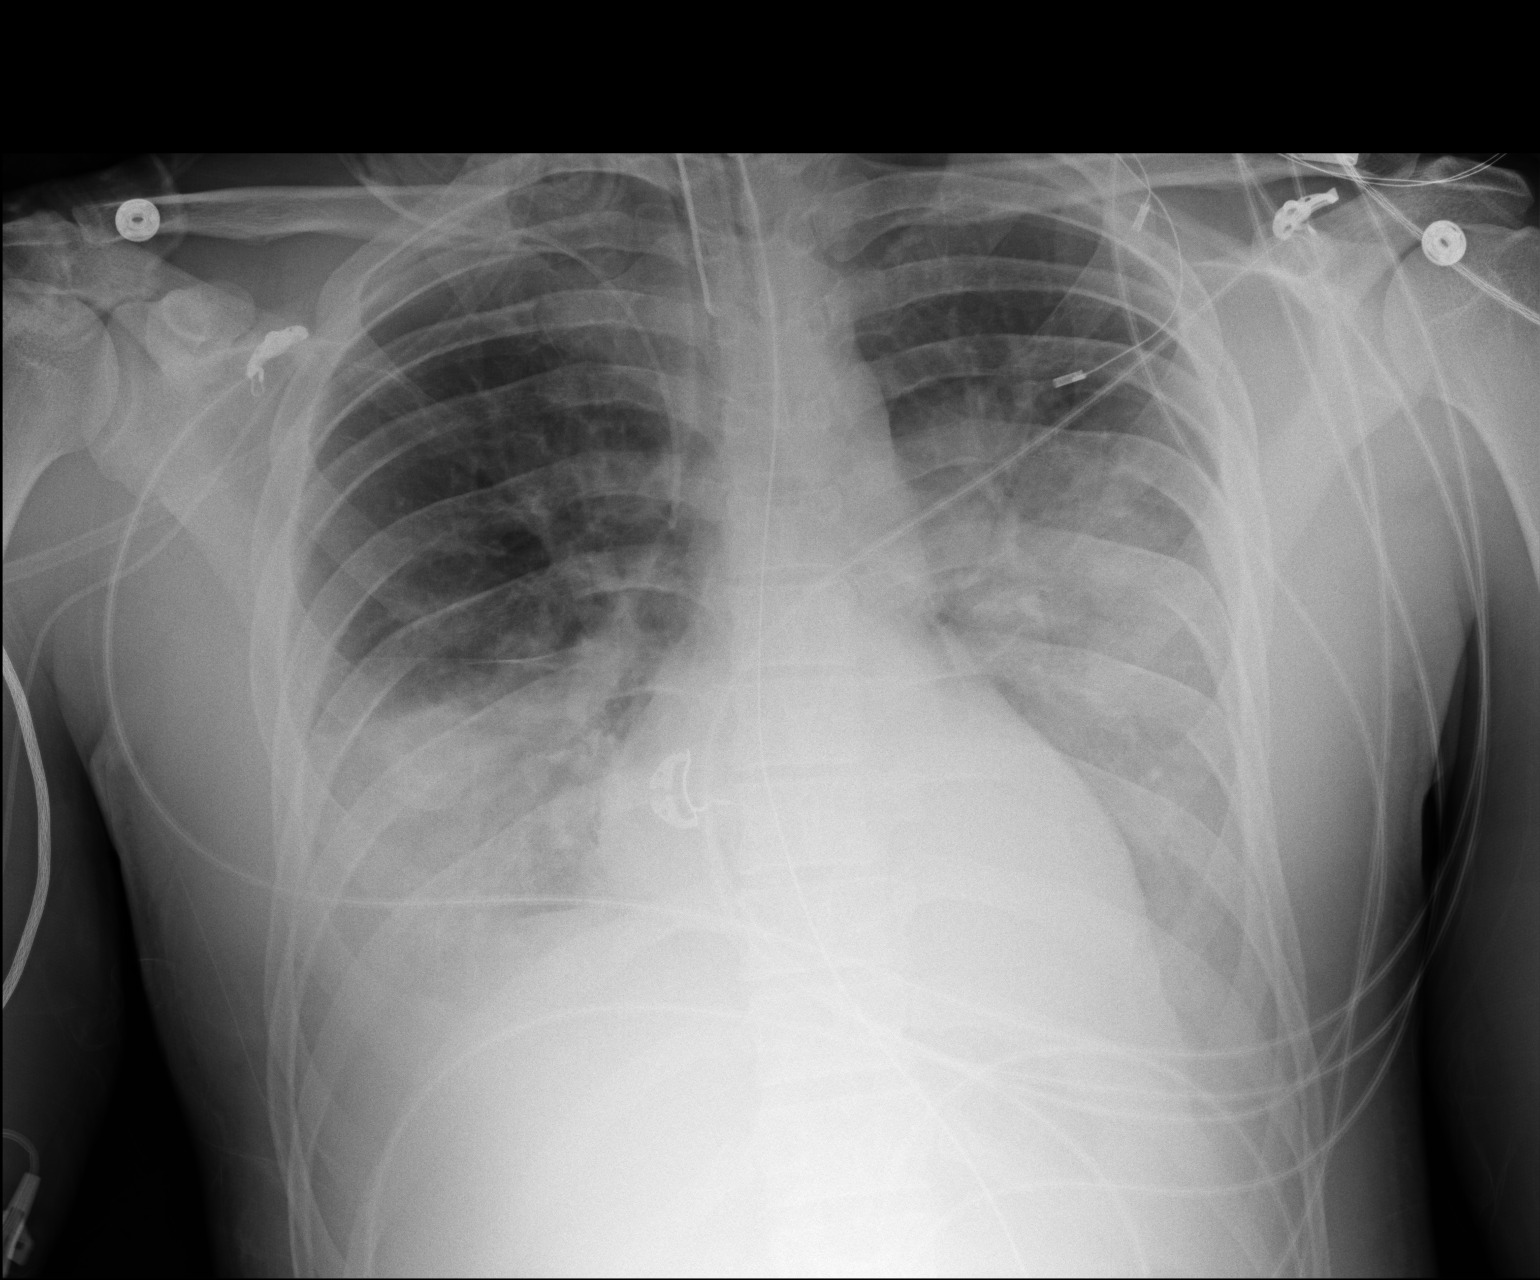

[2 of 2 positions shown; findings below may reference images not displayed]

FINDINGS: Endotracheal tube terminates 4 cm above the carina. Enteric tube
courses into the left upper abdomen with tip not imaged. Right PICC
terminates over the SVC. Cardiomediastinal silhouette is unchanged.
Bilateral airspace consolidation, greatest in the left midlung and
right lung base, has not significantly changed. Bilateral pleural
effusions are unchanged. No pneumothorax is identified. No acute
osseous abnormality is seen.
IMPRESSION: Unchanged bilateral airspace disease and pleural effusions.

## 2017-04-24 IMAGING — CR DG CHEST 1V PORT
1 series · 1 of 1 positions shown · non-contrast
Comparison: 05/28/2016

CLINICAL DATA: ARDS

EXAM:
PORTABLE CHEST 1 VIEW

[portable]
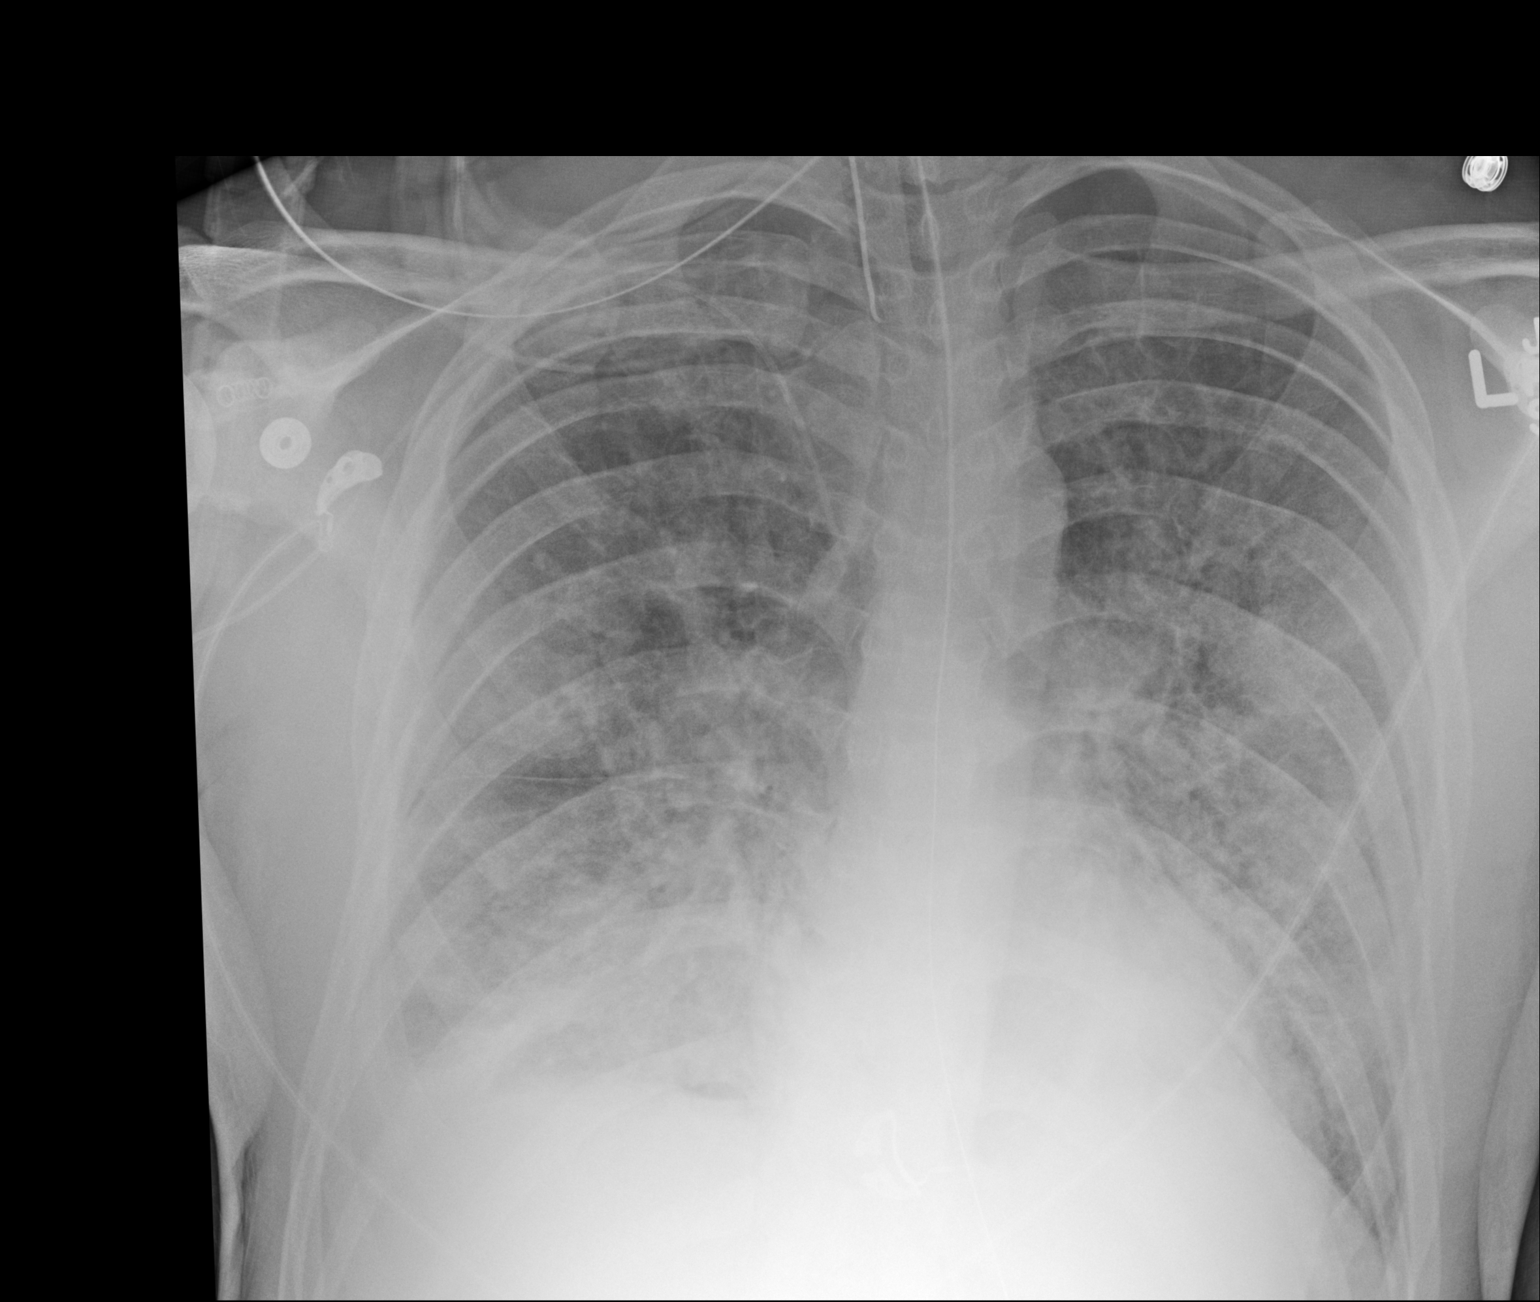

[1 of 1 positions shown; findings below may reference images not displayed]

FINDINGS: Cardiac shadow is stable. Right-sided PICC line, endotracheal tube
and nasogastric catheter are again seen and stable. There is been
some improved aeration in the bases bilaterally although diffuse
central vascular congestion and likely pulmonary edema is now
present. No bony abnormality is seen.
IMPRESSION: Improved aeration in the bases bilaterally. There is however new
vascular congestion and pulmonary edema noted bilaterally.
